# Patient Record
Sex: Female | Born: 1970 | Race: White | Hispanic: No | Marital: Married | Smoking: Never smoker
Health system: Southern US, Community
[De-identification: ages and names within clinical notes are randomized; demographics above are authoritative.]

## PROBLEM LIST (undated history)

## (undated) DIAGNOSIS — I10 Essential (primary) hypertension: Secondary | ICD-10-CM

## (undated) DIAGNOSIS — N83209 Unspecified ovarian cyst, unspecified side: Secondary | ICD-10-CM

## (undated) DIAGNOSIS — E782 Mixed hyperlipidemia: Secondary | ICD-10-CM

## (undated) DIAGNOSIS — R600 Localized edema: Secondary | ICD-10-CM

## (undated) DIAGNOSIS — Z803 Family history of malignant neoplasm of breast: Secondary | ICD-10-CM

## (undated) DIAGNOSIS — D124 Benign neoplasm of descending colon: Secondary | ICD-10-CM

## (undated) DIAGNOSIS — N840 Polyp of corpus uteri: Secondary | ICD-10-CM

## (undated) DIAGNOSIS — N939 Abnormal uterine and vaginal bleeding, unspecified: Secondary | ICD-10-CM

## (undated) DIAGNOSIS — N200 Calculus of kidney: Secondary | ICD-10-CM

## (undated) DIAGNOSIS — Z87442 Personal history of urinary calculi: Secondary | ICD-10-CM

## (undated) DIAGNOSIS — R920 Mammographic microcalcification found on diagnostic imaging of breast: Secondary | ICD-10-CM

## (undated) HISTORY — PX: TONSILLECTOMY: SUR1361

## (undated) HISTORY — PX: CHOLECYSTECTOMY, LAPAROSCOPIC: SHX56

## (undated) HISTORY — DX: Mammographic microcalcification found on diagnostic imaging of breast: R92.0

## (undated) HISTORY — DX: Family history of malignant neoplasm of breast: Z80.3

## (undated) HISTORY — DX: Abnormal uterine and vaginal bleeding, unspecified: N93.9

## (undated) HISTORY — DX: Calculus of kidney: N20.0

## (undated) HISTORY — DX: Unspecified ovarian cyst, unspecified side: N83.209

## (undated) HISTORY — PX: CHOLECYSTECTOMY: SHX55

## (undated) HISTORY — PX: TUBAL LIGATION: SHX77

## (undated) HISTORY — DX: Localized edema: R60.0

## (undated) HISTORY — DX: Essential (primary) hypertension: I10

## (undated) HISTORY — PX: DILATION AND CURETTAGE OF UTERUS: SHX78

---
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
## 2016-02-24 LAB — HM PAP SMEAR: HM Pap smear: NORMAL

## 2018-03-11 LAB — HM MAMMOGRAPHY

## 2019-03-03 ENCOUNTER — Ambulatory Visit: Payer: BC Managed Care – PPO | Admitting: Family Medicine

## 2019-03-03 ENCOUNTER — Encounter: Payer: Self-pay | Admitting: Family Medicine

## 2019-03-03 ENCOUNTER — Other Ambulatory Visit: Payer: Self-pay

## 2019-03-03 VITALS — BP 108/64 | HR 64 | Temp 96.2°F | Ht 66.0 in | Wt 152.0 lb

## 2019-03-03 DIAGNOSIS — I10 Essential (primary) hypertension: Secondary | ICD-10-CM

## 2019-03-03 DIAGNOSIS — E538 Deficiency of other specified B group vitamins: Secondary | ICD-10-CM

## 2019-03-03 DIAGNOSIS — Z803 Family history of malignant neoplasm of breast: Secondary | ICD-10-CM | POA: Diagnosis not present

## 2019-03-03 DIAGNOSIS — R600 Localized edema: Secondary | ICD-10-CM | POA: Diagnosis not present

## 2019-03-03 DIAGNOSIS — E559 Vitamin D deficiency, unspecified: Secondary | ICD-10-CM

## 2019-03-03 MED ORDER — HYDROCHLOROTHIAZIDE 25 MG PO TABS: 25.0000 mg | ORAL_TABLET | Freq: Every day | ORAL | 3 refills | Status: DC

## 2019-03-03 NOTE — Assessment & Plan Note (Signed)
Her family history is fairly significant for breast cancer, with a maternal uncle and her sister having breast cancer at a young age Her sister is reportedly BRCA negative, which is reassuring Encouraged regular mammograms Patient would like referral to GYN today, so that was placed We send ROI to her previous GYN for her last mammogram records and will order this if these are received prior to her seeing GYN

## 2019-03-03 NOTE — Progress Notes (Signed)
Patient: Anita Cowan, Female    DOB: 06-17-1970, 49 y.o.   MRN: [id number] Visit Date: 03/03/2019  Today's Provider: Lavon Paganini, MD   Chief Complaint  Patient presents with  . Establish Care   Subjective:     Establish Care Anita Cowan is a 49 y.o. female who presents today to establish care. She feels well. She reports exercising some. She reports she is sleeping well.  She moved to the area from [place] last summer  HTN: - Medications: Triamterene HCTZ 37.5-25 mg daily - Compliance: Good - Checking BP at home: Not recently - Denies any SOB, CP, vision changes, LE edema, medication SEs, or symptoms of hypotension -She does have persistent dependent lower extremity edema.  This is improved when she is on HCTZ or other diuretic, but was significant prior to being on a diuretic - Diet: Low-sodium and balanced   She has a family history of breast cancer in her sister in her 57s and in a maternal uncle.  Her sister was tested and did not have any genetic predisposition for breast cancer.  The patient herself has not been tested.  She states that her last mammogram had some calcifications on it and she thinks that she may be due for a repeat mammogram. -----------------------------------------------------------------   Review of Systems  Constitutional: Negative.   HENT: Negative.   Eyes: Negative.   Respiratory: Negative.   Cardiovascular: Positive for palpitations and leg swelling. Negative for chest pain.  Gastrointestinal: Negative.   Endocrine: Negative.   Genitourinary: Negative.   Musculoskeletal: Negative.   Skin: Negative.   Allergic/Immunologic: Positive for food allergies. Negative for environmental allergies and immunocompromised state.  Neurological: Negative.   Hematological: Negative.   Psychiatric/Behavioral: Negative.     Social History      She  reports that she has never smoked. She has never used smokeless tobacco. She reports that  she does not drink alcohol or use drugs.       Social History   Socioeconomic History  . Marital status: Married    Spouse name: Not on file  . Number of children: 3  . Years of education: Not on file  . Highest education level: Not on file  Occupational History  . Occupation: Pharmacist, hospital  Tobacco Use  . Smoking status: Never Smoker  . Smokeless tobacco: Never Used  Substance and Sexual Activity  . Alcohol use: Never  . Drug use: Never  . Sexual activity: Yes    Partners: Male    Birth control/protection: Surgical  Other Topics Concern  . Not on file  Social History Narrative  . Not on file   Social Determinants of Health   Financial Resource Strain:   . Difficulty of Paying Living Expenses: Not on file  Food Insecurity:   . Worried About Charity fundraiser in the Last Year: Not on file  . Ran Out of Food in the Last Year: Not on file  Transportation Needs:   . Lack of Transportation (Medical): Not on file  . Lack of Transportation (Non-Medical): Not on file  Physical Activity:   . Days of Exercise per Week: Not on file  . Minutes of Exercise per Session: Not on file  Stress:   . Feeling of Stress : Not on file  Social Connections:   . Frequency of Communication with Friends and Family: Not on file  . Frequency of Social Gatherings with Friends and Family: Not on file  . Attends Religious Services: Not  on file  . Active Member of Clubs or Organizations: Not on file  . Attends Archivist Meetings: Not on file  . Marital Status: Not on file    Past Medical History:  Diagnosis Date  . Essential hypertension   . Kidney stones   . Lower extremity edema      Patient Active Problem List   Diagnosis Date Noted  . Family history of breast cancer 03/03/2019  . Essential hypertension   . Lower extremity edema     Past Surgical History:  Procedure Laterality Date  . CHOLECYSTECTOMY, LAPAROSCOPIC    . KNEE ARTHROSCOPY Right 1989  . TONSILLECTOMY    .  TUBAL LIGATION      Family History        Family Status  Relation Name Status  . Mother  Alive  . Sister  Alive  . MGM  Deceased  . Mat Uncle  Deceased  . Mat Uncle  (Not Specified)  . Mat Uncle  (Not Specified)  . Neg Hx  (Not Specified)        Her family history includes Bladder Cancer in her maternal uncle; Breast cancer in her maternal uncle; Breast cancer (age of onset: 64) in her sister; Cervical cancer in her mother; Diabetes in her maternal uncle; Hypertension in her mother; Lung cancer in her maternal grandmother and maternal uncle; Lupus in her mother; Thyroid nodules in her sister. There is no history of Colon cancer.      Allergies  Allergen Reactions  . Azithromycin   . Codeine   . Penicillins   . Sulfa Antibiotics   . Tramadol   . Wheat Extract     Other reaction(s): GI Intolerance     Current Outpatient Medications:  .  Apple Cid Vn-Grn Tea-Bit Or-Cr (APPLE CIDER VINEGAR PLUS PO), Take by mouth., Disp: , Rfl:  .  cholecalciferol (VITAMIN D3) 25 MCG (1000 UNIT) tablet, Take 1,000 Units by mouth daily., Disp: , Rfl:  .  Collagen Hydrolysate POWD, by Does not apply route., Disp: , Rfl:  .  ELDERBERRY PO, Take by mouth., Disp: , Rfl:  .  Multiple Vitamin (MULTIVITAMIN) capsule, Take 1 capsule by mouth daily., Disp: , Rfl:  .  Turmeric (QC TUMERIC COMPLEX PO), Take by mouth., Disp: , Rfl:  .  vitamin B-12 (CYANOCOBALAMIN) 500 MCG tablet, Take 500 mcg by mouth daily., Disp: , Rfl:  .  hydrochlorothiazide (HYDRODIURIL) 25 MG tablet, Take 1 tablet (25 mg total) by mouth daily., Disp: 30 tablet, Rfl: 3   Patient Care Team: Virginia Crews, MD as PCP - General (Family Medicine)    Objective:    Vitals: BP 108/64 (BP Location: Right Arm, Patient Position: Sitting, Cuff Size: Large)   Pulse 64   Temp (!) 96.2 F (35.7 C) (Temporal)   Ht '5\' 6"'  (1.676 m)   Wt 152 lb (68.9 kg)   SpO2 99%   BMI 24.53 kg/m    Vitals:   03/03/19 1507  BP: 108/64  Pulse: 64   Temp: (!) 96.2 F (35.7 C)  TempSrc: Temporal  SpO2: 99%  Weight: 152 lb (68.9 kg)  Height: '5\' 6"'  (1.676 m)     Physical Exam   Depression Screen PHQ 2/9 Scores 03/03/2019  PHQ - 2 Score 0  PHQ- 9 Score 0       Assessment & Plan:     Establish care  Exercise Activities and Dietary recommendations Goals   None  There is no immunization history on file for this patient.  Health Maintenance  Topic Date Due  . HIV Screening  11/01/1986  . TETANUS/TDAP  11/01/1990  . PAP SMEAR-Modifier  10/31/1992  . INFLUENZA VACCINE  04/23/2019 (Originally 08/24/2018)     Discussed health benefits of physical activity, and encouraged her to engage in regular exercise appropriate for her age and condition.     ROI sent to previous PCP and GYN --------------------------------------------------------------------  Problem List Items Addressed This Visit      Cardiovascular and Mediastinum   Essential hypertension - Primary    Chronic and well-controlled Recent weight loss and improvement in diet likely helped this We will change her triamterene HCTZ to HCTZ 25 mg daily Check metabolic panel Screening lipid panel Follow-up in 6 months or sooner if home blood pressure elevates      Relevant Medications   hydrochlorothiazide (HYDRODIURIL) 25 MG tablet   Other Relevant Orders   Comprehensive metabolic panel   Lipid panel   CBC     Other   Lower extremity edema    Likely has some venous insufficiency It is improved with diuretic, so we will continue HCTZ as above Encourage compression stocking use      Relevant Orders   CBC   Family history of breast cancer    Her family history is fairly significant for breast cancer, with a maternal uncle and her sister having breast cancer at a young age Her sister is reportedly BRCA negative, which is reassuring Encouraged regular mammograms Patient would like referral to GYN today, so that was placed We send ROI to her  previous GYN for her last mammogram records and will order this if these are received prior to her seeing GYN      Relevant Orders   Ambulatory referral to Gynecology    Other Visit Diagnoses    Avitaminosis D       Relevant Orders   VITAMIN D 25 Hydroxy (Vit-D Deficiency, Fractures)   B12 deficiency       Relevant Orders   B12       Return in about 6 months (around 08/31/2019) for CPE.   The entirety of the information documented in the History of Present Illness, Review of Systems and Physical Exam were personally obtained by me. Portions of this information were initially documented by Ashley Royalty, CMA and reviewed by me for thoroughness and accuracy.    , Dionne Bucy, MD MPH [facility]

## 2019-03-03 NOTE — Assessment & Plan Note (Signed)
Chronic and well-controlled Recent weight loss and improvement in diet likely helped this We will change her triamterene HCTZ to HCTZ 25 mg daily Check metabolic panel Screening lipid panel Follow-up in 6 months or sooner if home blood pressure elevates

## 2019-03-03 NOTE — Patient Instructions (Signed)
DASH Eating Plan DASH stands for "Dietary Approaches to Stop Hypertension." The DASH eating plan is a healthy eating plan that has been shown to reduce high blood pressure (hypertension). It may also reduce your risk for type 2 diabetes, heart disease, and stroke. The DASH eating plan may also help with weight loss. What are tips for following this plan?  General guidelines  Avoid eating more than 2,300 mg (milligrams) of salt (sodium) a day. If you have hypertension, you may need to reduce your sodium intake to 1,500 mg a day.  Limit alcohol intake to no more than 1 drink a day for nonpregnant women and 2 drinks a day for men. One drink equals 12 oz of beer, 5 oz of wine, or 1 oz of hard liquor.  Work with your health care provider to maintain a healthy body weight or to lose weight. Ask what an ideal weight is for you.  Get at least 30 minutes of exercise that causes your heart to beat faster (aerobic exercise) most days of the week. Activities may include walking, swimming, or biking.  Work with your health care provider or diet and nutrition specialist (dietitian) to adjust your eating plan to your individual calorie needs. Reading food labels   Check food labels for the amount of sodium per serving. Choose foods with less than 5 percent of the Daily Value of sodium. Generally, foods with less than 300 mg of sodium per serving fit into this eating plan.  To find whole grains, look for the word "whole" as the first word in the ingredient list. Shopping  Buy products labeled as "low-sodium" or "no salt added."  Buy fresh foods. Avoid canned foods and premade or frozen meals. Cooking  Avoid adding salt when cooking. Use salt-free seasonings or herbs instead of table salt or sea salt. Check with your health care provider or pharmacist before using salt substitutes.  Do not fry foods. Cook foods using healthy methods such as baking, boiling, grilling, and broiling instead.  Cook with  heart-healthy oils, such as olive, canola, soybean, or sunflower oil. Meal planning  Eat a balanced diet that includes: ? 5 or more servings of fruits and vegetables each day. At each meal, try to fill half of your plate with fruits and vegetables. ? Up to 6-8 servings of whole grains each day. ? Less than 6 oz of lean meat, poultry, or fish each day. A 3-oz serving of meat is about the same size as a deck of cards. One egg equals 1 oz. ? 2 servings of low-fat dairy each day. ? A serving of nuts, seeds, or beans 5 times each week. ? Heart-healthy fats. Healthy fats called Omega-3 fatty acids are found in foods such as flaxseeds and coldwater fish, like sardines, salmon, and mackerel.  Limit how much you eat of the following: ? Canned or prepackaged foods. ? Food that is high in trans fat, such as fried foods. ? Food that is high in saturated fat, such as fatty meat. ? Sweets, desserts, sugary drinks, and other foods with added sugar. ? Full-fat dairy products.  Do not salt foods before eating.  Try to eat at least 2 vegetarian meals each week.  Eat more home-cooked food and less restaurant, buffet, and fast food.  When eating at a restaurant, ask that your food be prepared with less salt or no salt, if possible. What foods are recommended? The items listed may not be a complete list. Talk with your dietitian about   what dietary choices are best for you. Grains Whole-grain or whole-wheat bread. Whole-grain or whole-wheat pasta. Brown rice. Oatmeal. Quinoa. Bulgur. Whole-grain and low-sodium cereals. Pita bread. Low-fat, low-sodium crackers. Whole-wheat flour tortillas. Vegetables Fresh or frozen vegetables (raw, steamed, roasted, or grilled). Low-sodium or reduced-sodium tomato and vegetable juice. Low-sodium or reduced-sodium tomato sauce and tomato paste. Low-sodium or reduced-sodium canned vegetables. Fruits All fresh, dried, or frozen fruit. Canned fruit in natural juice (without  added sugar). Meat and other protein foods Skinless chicken or turkey. Ground chicken or turkey. Pork with fat trimmed off. Fish and seafood. Egg whites. Dried beans, peas, or lentils. Unsalted nuts, nut butters, and seeds. Unsalted canned beans. Lean cuts of beef with fat trimmed off. Low-sodium, lean deli meat. Dairy Low-fat (1%) or fat-free (skim) milk. Fat-free, low-fat, or reduced-fat cheeses. Nonfat, low-sodium ricotta or cottage cheese. Low-fat or nonfat yogurt. Low-fat, low-sodium cheese. Fats and oils Soft margarine without trans fats. Vegetable oil. Low-fat, reduced-fat, or light mayonnaise and salad dressings (reduced-sodium). Canola, safflower, olive, soybean, and sunflower oils. Avocado. Seasoning and other foods Herbs. Spices. Seasoning mixes without salt. Unsalted popcorn and pretzels. Fat-free sweets. What foods are not recommended? The items listed may not be a complete list. Talk with your dietitian about what dietary choices are best for you. Grains Baked goods made with fat, such as croissants, muffins, or some breads. Dry pasta or rice meal packs. Vegetables Creamed or fried vegetables. Vegetables in a cheese sauce. Regular canned vegetables (not low-sodium or reduced-sodium). Regular canned tomato sauce and paste (not low-sodium or reduced-sodium). Regular tomato and vegetable juice (not low-sodium or reduced-sodium). Pickles. Olives. Fruits Canned fruit in a light or heavy syrup. Fried fruit. Fruit in cream or butter sauce. Meat and other protein foods Fatty cuts of meat. Ribs. Fried meat. Bacon. Sausage. Bologna and other processed lunch meats. Salami. Fatback. Hotdogs. Bratwurst. Salted nuts and seeds. Canned beans with added salt. Canned or smoked fish. Whole eggs or egg yolks. Chicken or turkey with skin. Dairy Whole or 2% milk, cream, and half-and-half. Whole or full-fat cream cheese. Whole-fat or sweetened yogurt. Full-fat cheese. Nondairy creamers. Whipped toppings.  Processed cheese and cheese spreads. Fats and oils Butter. Stick margarine. Lard. Shortening. Ghee. Bacon fat. Tropical oils, such as coconut, palm kernel, or palm oil. Seasoning and other foods Salted popcorn and pretzels. Onion salt, garlic salt, seasoned salt, table salt, and sea salt. Worcestershire sauce. Tartar sauce. Barbecue sauce. Teriyaki sauce. Soy sauce, including reduced-sodium. Steak sauce. Canned and packaged gravies. Fish sauce. Oyster sauce. Cocktail sauce. Horseradish that you find on the shelf. Ketchup. Mustard. Meat flavorings and tenderizers. Bouillon cubes. Hot sauce and Tabasco sauce. Premade or packaged marinades. Premade or packaged taco seasonings. Relishes. Regular salad dressings. Where to find more information:  National Heart, Lung, and Blood Institute: www.nhlbi.nih.gov  American Heart Association: www.heart.org Summary  The DASH eating plan is a healthy eating plan that has been shown to reduce high blood pressure (hypertension). It may also reduce your risk for type 2 diabetes, heart disease, and stroke.  With the DASH eating plan, you should limit salt (sodium) intake to 2,300 mg a day. If you have hypertension, you may need to reduce your sodium intake to 1,500 mg a day.  When on the DASH eating plan, aim to eat more fresh fruits and vegetables, whole grains, lean proteins, low-fat dairy, and heart-healthy fats.  Work with your health care provider or diet and nutrition specialist (dietitian) to adjust your eating plan to your   individual calorie needs. This information is not intended to replace advice given to you by your health care provider. Make sure you discuss any questions you have with your health care provider. Document Revised: 12/22/2016 Document Reviewed: 01/03/2016 Elsevier Patient Education  2020 [employer].  

## 2019-03-03 NOTE — Assessment & Plan Note (Signed)
Likely has some venous insufficiency It is improved with diuretic, so we will continue HCTZ as above Encourage compression stocking use

## 2019-03-18 ENCOUNTER — Telehealth: Payer: Self-pay

## 2019-03-18 LAB — CBC
Hematocrit: 45.1 % (ref 34.0–46.6)
Hemoglobin: 15 g/dL (ref 11.1–15.9)
MCH: 30.1 pg (ref 26.6–33.0)
MCHC: 33.3 g/dL (ref 31.5–35.7)
MCV: 90 fL (ref 79–97)
Platelets: 333 10*3/uL (ref 150–450)
RBC: 4.99 x10E6/uL (ref 3.77–5.28)
RDW: 13 % (ref 11.7–15.4)
WBC: 6.8 10*3/uL (ref 3.4–10.8)

## 2019-03-18 LAB — COMPREHENSIVE METABOLIC PANEL
ALT: 14 IU/L (ref 0–32)
AST: 20 IU/L (ref 0–40)
Albumin/Globulin Ratio: 2.1 (ref 1.2–2.2)
Albumin: 4.9 g/dL — ABNORMAL HIGH (ref 3.8–4.8)
Alkaline Phosphatase: 51 IU/L (ref 39–117)
BUN/Creatinine Ratio: 16 (ref 9–23)
BUN: 14 mg/dL (ref 6–24)
Bilirubin Total: 0.4 mg/dL (ref 0.0–1.2)
CO2: 24 mmol/L (ref 20–29)
Calcium: 9.6 mg/dL (ref 8.7–10.2)
Chloride: 97 mmol/L (ref 96–106)
Creatinine, Ser: 0.86 mg/dL (ref 0.57–1.00)
GFR calc Af Amer: 93 mL/min/{1.73_m2} (ref >59)
GFR calc non Af Amer: 81 mL/min/{1.73_m2} (ref >59)
Globulin, Total: 2.3 g/dL (ref 1.5–4.5)
Glucose: 89 mg/dL (ref 65–99)
Potassium: 3.6 mmol/L (ref 3.5–5.2)
Sodium: 136 mmol/L (ref 134–144)
Total Protein: 7.2 g/dL (ref 6.0–8.5)

## 2019-03-18 LAB — LIPID PANEL
Chol/HDL Ratio: 3.9 ratio (ref 0.0–4.4)
Cholesterol, Total: 188 mg/dL (ref 100–199)
HDL: 48 mg/dL (ref >39)
LDL Chol Calc (NIH): 123 mg/dL — ABNORMAL HIGH (ref 0–99)
Triglycerides: 93 mg/dL (ref 0–149)
VLDL Cholesterol Cal: 17 mg/dL (ref 5–40)

## 2019-03-18 LAB — VITAMIN D 25 HYDROXY (VIT D DEFICIENCY, FRACTURES): Vit D, 25-Hydroxy: 48.8 ng/mL (ref 30.0–100.0)

## 2019-03-18 LAB — VITAMIN B12: Vitamin B-12: 1917 pg/mL — ABNORMAL HIGH (ref 232–1245)

## 2019-03-18 NOTE — Telephone Encounter (Signed)
-----   Message from Virginia Crews, MD sent at 03/18/2019  9:31 AM EST ----- Normal labs, except high B12 level.  She can cut back on the amount of B12 that she is taking by half.

## 2019-03-18 NOTE — Telephone Encounter (Signed)
Tired to call patient and no answer, left voicemail for patient to call back. If patient returns call it is ok for [facility] to advise patient of results.

## 2019-03-18 NOTE — Telephone Encounter (Signed)
Called left message for return call.

## 2019-03-18 NOTE — Progress Notes (Signed)
PCP:  Virginia Crews, MD   Chief Complaint  Patient presents with  . Gynecologic Exam    a lot of pain on left ovary only during cycles     HPI:      Ms. Anita Cowan is a 49 y.o. No obstetric history on file. who LMP was Patient's last menstrual period was 02/24/2019 (approximate)., presents today for her NP annual examination.  Her menses are regular every 28-30 days, lasting 7 days.  Dysmenorrhea moderate, improved with ibup/heating pad. Has had LLQ pain mid cycle and with menses for past few months. Feels similar to ovar cyst LT side a few yrs ago. Has had LLQ pain with sex recently too, which is unusual for pt. She does not have intermenstrual bleeding.  Sex activity: single partner, contraception - tubal ligation. No postcoital bleeding. Last Pap: not recent; no hx of abn paps. Hx of STDs: none  Last mammogram: 03/11/18 in [place]. Has been followed for LT breast microcalcifications for a few yrs. Thinks mammo is due again 2/21. Trying to get records sent.  There is a FH of breast cancer in her sister and mat uncle twice. Her sister is BRCA neg 2 yrs ago but didn't do panel testing. Pt has not had genetic testing. There is no FH of ovarian cancer. The patient does do self-breast exams.  Tobacco use: The patient denies current or previous tobacco use. Alcohol use: none No drug use.  Exercise: moderately active  She does get adequate calcium and Vitamin D in her diet.  Labs with PCP  Past Medical History:  Diagnosis Date  . Breast microcalcifications   . Essential hypertension   . Family history of breast cancer   . Kidney stones   . Lower extremity edema   . Ovarian cyst     Past Surgical History:  Procedure Laterality Date  . CHOLECYSTECTOMY, LAPAROSCOPIC    . KNEE ARTHROSCOPY Right 1989  . TONSILLECTOMY    . TUBAL LIGATION      Family History  Problem Relation Age of Onset  . Hypertension Mother   . Lupus Mother        discoid  . Cervical  cancer Mother   . Breast cancer Sister 78  . Thyroid nodules Sister        thyroid tumor in her teens  . Lung cancer Maternal Grandmother        smoker  . Diabetes Maternal Uncle   . Breast cancer Maternal Uncle        twice  . Bladder Cancer Maternal Uncle   . Lung cancer Maternal Uncle   . Colon cancer Neg Hx     Social History   Socioeconomic History  . Marital status: Married    Spouse name: Not on file  . Number of children: 3  . Years of education: Not on file  . Highest education level: Not on file  Occupational History  . Occupation: Pharmacist, hospital  Tobacco Use  . Smoking status: Never Smoker  . Smokeless tobacco: Never Used  Substance and Sexual Activity  . Alcohol use: Never  . Drug use: Never  . Sexual activity: Yes    Partners: Male    Birth control/protection: Surgical    Comment: Tubal ligation  Other Topics Concern  . Not on file  Social History Narrative  . Not on file   Social Determinants of Health   Financial Resource Strain:   . Difficulty of Paying Living Expenses: Not on file  Food Insecurity:   . Worried About Charity fundraiser in the Last Year: Not on file  . Ran Out of Food in the Last Year: Not on file  Transportation Needs:   . Lack of Transportation (Medical): Not on file  . Lack of Transportation (Non-Medical): Not on file  Physical Activity:   . Days of Exercise per Week: Not on file  . Minutes of Exercise per Session: Not on file  Stress:   . Feeling of Stress : Not on file  Social Connections:   . Frequency of Communication with Friends and Family: Not on file  . Frequency of Social Gatherings with Friends and Family: Not on file  . Attends Religious Services: Not on file  . Active Member of Clubs or Organizations: Not on file  . Attends Archivist Meetings: Not on file  . Marital Status: Not on file  Intimate Partner Violence:   . Fear of Current or Ex-Partner: Not on file  . Emotionally Abused: Not on file  .  Physically Abused: Not on file  . Sexually Abused: Not on file     Current Outpatient Medications:  .  Apple Cid Vn-Grn Tea-Bit Or-Cr (APPLE CIDER VINEGAR PLUS PO), Take by mouth., Disp: , Rfl:  .  cholecalciferol (VITAMIN D3) 25 MCG (1000 UNIT) tablet, Take 1,000 Units by mouth daily., Disp: , Rfl:  .  Collagen Hydrolysate POWD, by Does not apply route., Disp: , Rfl:  .  ELDERBERRY PO, Take by mouth., Disp: , Rfl:  .  hydrochlorothiazide (HYDRODIURIL) 25 MG tablet, Take 1 tablet (25 mg total) by mouth daily., Disp: 30 tablet, Rfl: 3 .  Multiple Vitamin (MULTIVITAMIN) capsule, Take 1 capsule by mouth daily., Disp: , Rfl:  .  Turmeric (QC TUMERIC COMPLEX PO), Take by mouth., Disp: , Rfl:  .  vitamin B-12 (CYANOCOBALAMIN) 500 MCG tablet, Take 500 mcg by mouth daily., Disp: , Rfl:      ROS:  Review of Systems  Constitutional: Negative for fatigue, fever and unexpected weight change.  Respiratory: Negative for cough, shortness of breath and wheezing.   Cardiovascular: Negative for chest pain, palpitations and leg swelling.  Gastrointestinal: Negative for blood in stool, constipation, diarrhea, nausea and vomiting.  Endocrine: Negative for cold intolerance, heat intolerance and polyuria.  Genitourinary: Positive for dyspareunia and pelvic pain. Negative for dysuria, flank pain, frequency, genital sores, hematuria, menstrual problem, urgency, vaginal bleeding, vaginal discharge and vaginal pain.  Musculoskeletal: Negative for back pain, joint swelling and myalgias.  Skin: Negative for rash.  Neurological: Negative for dizziness, syncope, light-headedness, numbness and headaches.  Hematological: Negative for adenopathy.  Psychiatric/Behavioral: Negative for agitation, confusion, sleep disturbance and suicidal ideas. The patient is not nervous/anxious.   BREAST: No symptoms   Objective: BP 100/70   Ht '5\' 6"'  (1.676 m)   Wt 148 lb (67.1 kg)   LMP 02/24/2019 (Approximate)   BMI 23.89  kg/m    Physical Exam Constitutional:      Appearance: She is well-developed.  Genitourinary:     Vulva, vagina, cervix, uterus, right adnexa and left adnexa normal.     No vulval lesion or tenderness noted.     No vaginal discharge, erythema or tenderness.     No cervical polyp.     Uterus is not enlarged or tender.     No right or left adnexal mass present.     Right adnexa not tender.     Left adnexa not tender.  Neck:  Thyroid: No thyromegaly.  Cardiovascular:     Rate and Rhythm: Normal rate and regular rhythm.     Heart sounds: Normal heart sounds. No murmur.  Pulmonary:     Effort: Pulmonary effort is normal.     Breath sounds: Normal breath sounds.  Chest:     Breasts:        Right: No mass, nipple discharge, skin change or tenderness.        Left: No mass, nipple discharge, skin change or tenderness.  Abdominal:     Palpations: Abdomen is soft.     Tenderness: There is no abdominal tenderness. There is no guarding.  Musculoskeletal:        General: Normal range of motion.     Cervical back: Normal range of motion.  Neurological:     General: No focal deficit present.     Mental Status: She is alert and oriented to person, place, and time.     Cranial Nerves: No cranial nerve deficit.  Skin:    General: Skin is warm and dry.  Psychiatric:        Mood and Affect: Mood normal.        Behavior: Behavior normal.        Thought Content: Thought content normal.        Judgment: Judgment normal.  Vitals reviewed.     Results:  ULTRASOUND REPORT  Location: [facility]  Date of Service: 03/19/2019    Indications:Pelvic Pain Findings:  The uterus is retroverted and measures 9.2 x 6.3 x 5.4 cm. Echo texture is homogenous without evidence of focal masses. The Endometrium measures 15.7 mm.  Right Ovary measures 3.0 x 1.7 x 1.5 cm. It is normal in appearance. Left Ovary measures 3.2 x 2.5 x 2.3 cm. It is normal in appearance. There is a corpus  luteal cyst in the left ovary that measures 18.7 x 16.6 x 20.2 mm Survey of the adnexa demonstrates no adnexal masses. There is no free fluid in the cul de sac.  Impression: 1. Normal uterus and cervix.  2. Normal ovaries bilaterally.  3. There is a corpus luteal cyst in the left ovary.   Recommendations: 1.Clinical correlation with the patient's History and Physical Exam.   Gweneth Dimitri, RT  Assessment/Plan:  Encounter for annual routine gynecological examination  Cervical cancer screening - Plan: Cytology - PAP  Screening for HPV (human papillomavirus) - Plan: Cytology - PAP  Encounter for screening mammogram for malignant neoplasm of breast - Plan: MM 3D SCREEN BREAST BILATERAL; pt to sched mammo. May need dx +/- u/s depending on prior mammo results. Pt going to get records faxed to me for review. Will change order prn.   Family history of breast cancer - Plan: MM 3D SCREEN BREAST BILATERAL; MyRisk testing discussed and pt will RTO for labs. Has "1 good vein" and just had blood drawn 2 days ago.   LLQ pain - Plan: US PELVIS TRANSVAGINAL NON-OB (TV ONLY); Corpus luteal cyst LT ovary. Could be cause of sx. Should resolve, no further imaging needed. NSAIDs. F/u prn.             GYN counsel breast self exam, mammography screening, adequate intake of calcium and vitamin D, diet and exercise     F/U  Return in about 1 year (around 03/18/2020) for annual/ RTO with RN for [facility] testing.   B. , PA-C 03/19/2019 4:55 PM

## 2019-03-19 ENCOUNTER — Ambulatory Visit (INDEPENDENT_AMBULATORY_CARE_PROVIDER_SITE_OTHER): Payer: BC Managed Care – PPO

## 2019-03-19 ENCOUNTER — Other Ambulatory Visit: Payer: Self-pay

## 2019-03-19 ENCOUNTER — Encounter: Payer: Self-pay | Admitting: Obstetrics and Gynecology

## 2019-03-19 ENCOUNTER — Ambulatory Visit (INDEPENDENT_AMBULATORY_CARE_PROVIDER_SITE_OTHER): Payer: BC Managed Care – PPO | Admitting: Obstetrics and Gynecology

## 2019-03-19 ENCOUNTER — Other Ambulatory Visit (HOSPITAL_COMMUNITY)
Admission: RE | Admit: 2019-03-19 | Discharge: 2019-03-19 | Disposition: A | Discharge status: Home / Self Care | Payer: BC Managed Care – PPO | Source: Ambulatory Visit | Attending: Obstetrics and Gynecology | Admitting: Obstetrics and Gynecology

## 2019-03-19 VITALS — BP 100/70 | Ht 66.0 in | Wt 148.0 lb

## 2019-03-19 DIAGNOSIS — R1032 Left lower quadrant pain: Secondary | ICD-10-CM

## 2019-03-19 DIAGNOSIS — Z124 Encounter for screening for malignant neoplasm of cervix: Secondary | ICD-10-CM | POA: Insufficient documentation

## 2019-03-19 DIAGNOSIS — Z01419 Encounter for gynecological examination (general) (routine) without abnormal findings: Secondary | ICD-10-CM

## 2019-03-19 DIAGNOSIS — Z1151 Encounter for screening for human papillomavirus (HPV): Secondary | ICD-10-CM

## 2019-03-19 DIAGNOSIS — N8312 Corpus luteum cyst of left ovary: Secondary | ICD-10-CM

## 2019-03-19 DIAGNOSIS — Z1231 Encounter for screening mammogram for malignant neoplasm of breast: Secondary | ICD-10-CM

## 2019-03-19 DIAGNOSIS — N941 Unspecified dyspareunia: Secondary | ICD-10-CM

## 2019-03-19 DIAGNOSIS — Z01411 Encounter for gynecological examination (general) (routine) with abnormal findings: Secondary | ICD-10-CM

## 2019-03-19 DIAGNOSIS — Z803 Family history of malignant neoplasm of breast: Secondary | ICD-10-CM

## 2019-03-19 NOTE — Patient Instructions (Signed)
I value your feedback and entrusting us with your care. If you get a [facility] patient survey, I would appreciate you taking the time to let us know about your experience today. Thank you!  As of January 02, 2019, your lab results will be released to your MyChart immediately, before I even have a chance to see them. Please give me time to review them and contact you if there are any abnormalities. Thank you for your patience.   [facility]: [phone]  [facility]: [phone]  

## 2019-03-20 NOTE — Telephone Encounter (Signed)
Patient returned call- notified of lab results and PCP recommendation

## 2019-03-21 LAB — CYTOLOGY - PAP
Comment: NEGATIVE
Diagnosis: NEGATIVE
High risk HPV: NEGATIVE

## 2019-03-24 ENCOUNTER — Other Ambulatory Visit: Payer: Self-pay

## 2019-03-24 ENCOUNTER — Other Ambulatory Visit: Payer: BC Managed Care – PPO

## 2019-03-24 ENCOUNTER — Ambulatory Visit: Payer: BC Managed Care – PPO

## 2019-03-24 DIAGNOSIS — Z1371 Encounter for nonprocreative screening for genetic disease carrier status: Secondary | ICD-10-CM

## 2019-03-24 HISTORY — DX: Encounter for nonprocreative screening for genetic disease carrier status: Z13.71

## 2019-03-31 ENCOUNTER — Encounter: Payer: Self-pay | Admitting: Obstetrics and Gynecology

## 2019-04-08 ENCOUNTER — Encounter: Payer: Self-pay | Admitting: Obstetrics and Gynecology

## 2019-04-10 ENCOUNTER — Encounter: Payer: Self-pay | Admitting: Family Medicine

## 2019-04-12 ENCOUNTER — Encounter: Payer: Self-pay | Admitting: Obstetrics and Gynecology

## 2019-04-15 ENCOUNTER — Telehealth: Payer: Self-pay | Admitting: Obstetrics and Gynecology

## 2019-04-15 NOTE — Telephone Encounter (Signed)
Pt aware of neg MyRisk results. IBIS=12.8%/riskscore=18.9%. No extra screening recommendations. Cont monthly SBE, yearly CBE and mammo.   Patient understands these results only apply to her and her children, and this is not indicative of genetic testing results of her other family members. It is recommended that her other family members have genetic testing done.  Pt also understands negative genetic testing doesn't mean she will never get any of these cancers.   Hard copy mailed to pt. F/u prn.

## 2019-05-05 ENCOUNTER — Ambulatory Visit
Admission: RE | Admit: 2019-05-05 | Discharge: 2019-05-05 | Disposition: A | Discharge status: Home / Self Care | Payer: BC Managed Care – PPO | Source: Ambulatory Visit | Attending: Obstetrics and Gynecology | Admitting: Obstetrics and Gynecology

## 2019-05-05 DIAGNOSIS — Z1231 Encounter for screening mammogram for malignant neoplasm of breast: Secondary | ICD-10-CM | POA: Diagnosis not present

## 2019-05-05 DIAGNOSIS — Z803 Family history of malignant neoplasm of breast: Secondary | ICD-10-CM | POA: Insufficient documentation

## 2019-05-19 ENCOUNTER — Encounter: Payer: Self-pay | Admitting: Obstetrics and Gynecology

## 2019-05-20 ENCOUNTER — Telehealth: Payer: Self-pay

## 2019-05-20 NOTE — Telephone Encounter (Signed)
Copied from [place] [id number]. Topic: General - Other >> May 20, 2019  8:16 AM Leward Quan A wrote: Reason for CRM: Patient called to inquire of Dr B that if she was exposed to covid and has no symptoms. She would like Dr B. Insight if she should get tested and since she have no symptoms or can she just go ahead and get her covid vaccine. Would like a call back at Ph# [phone]

## 2019-05-20 NOTE — Telephone Encounter (Signed)
I would recommend testing on day 5-7 after exposure.  If negative, proceed with COVID vaccine.

## 2019-05-20 NOTE — Telephone Encounter (Signed)
Patient advised as directed below. 

## 2019-07-18 ENCOUNTER — Encounter: Payer: Self-pay | Admitting: Family Medicine

## 2019-07-18 ENCOUNTER — Ambulatory Visit: Payer: BC Managed Care – PPO | Admitting: Family Medicine

## 2019-07-18 ENCOUNTER — Other Ambulatory Visit: Payer: Self-pay

## 2019-07-18 VITALS — BP 109/74 | HR 73 | Temp 97.1°F | Resp 16 | Ht 66.0 in | Wt 138.0 lb

## 2019-07-18 DIAGNOSIS — G478 Other sleep disorders: Secondary | ICD-10-CM | POA: Diagnosis not present

## 2019-07-18 DIAGNOSIS — R0681 Apnea, not elsewhere classified: Secondary | ICD-10-CM | POA: Diagnosis not present

## 2019-07-18 DIAGNOSIS — I10 Essential (primary) hypertension: Secondary | ICD-10-CM | POA: Diagnosis not present

## 2019-07-18 NOTE — Assessment & Plan Note (Addendum)
Well controlled  With weight loss and healthy lifestyle changes D/C HCTZ do to low readings Was doing HCTZ 12.5mg  daily Continue monitoring BP  Follow up in August for CPE

## 2019-07-18 NOTE — Progress Notes (Signed)
Established patient visit   Patient: Anita Cowan   DOB: 1970/08/02   49 y.o. Female  MRN: [id number] Visit Date: 07/18/2019  I,Sulibeya S Dimas,acting as a scribe for Lavon Paganini, MD.,have documented all relevant documentation on the behalf of Lavon Paganini, MD,as directed by  Lavon Paganini, MD while in the presence of Lavon Paganini, MD.  Today's healthcare provider: Lavon Paganini, MD   Chief Complaint  Patient presents with  . Hypertension  . Sleep Apnea   Subjective    HPI Hypertension, follow-up  BP Readings from Last 3 Encounters:  07/18/19 109/74  03/19/19 100/70  03/03/19 108/64   Wt Readings from Last 3 Encounters:  07/18/19 138 lb (62.6 kg)  03/19/19 148 lb (67.1 kg)  03/03/19 152 lb (68.9 kg)     She was last seen for hypertension 4 months ago.  BP at that visit was 108/64. Management since that visit includes HCTZ 25mg  daily.  She reports excellent compliance with treatment. She is having side effects. dizziness She is following a Regular diet. She is exercising. She does not smoke.  Use of agents associated with hypertension: none.   Outside blood pressures are stable and on the lower side. Symptoms: No chest pain No chest pressure  No palpitations No syncope  No dyspnea No orthopnea  No paroxysmal nocturnal dyspnea No lower extremity edema   Pertinent labs: Lab Results  Component Value Date   CHOL 188 03/17/2019   HDL 48 03/17/2019   LDLCALC 123 (H) 03/17/2019   TRIG 93 03/17/2019   CHOLHDL 3.9 03/17/2019   Lab Results  Component Value Date   NA 136 03/17/2019   K 3.6 03/17/2019   CREATININE 0.86 03/17/2019   GFRNONAA 81 03/17/2019   GFRAA 93 03/17/2019   GLUCOSE 89 03/17/2019     The 10-year ASCVD risk score Mikey Bussing DC Jr., et al., 2013) is: 0.8%   --------------------------------------------------------------------------------------------------- Patient report witnessed sleep apnea. Patient reports  waking up some mornings feeling un rested, and with headaches.   Results of the Epworth flowsheet 07/18/2019  Sitting and reading 1  Watching TV 1  Sitting, inactive in a public place (e.g. a theatre or a meeting) 0  As a passenger in a car for an hour without a break 0  Lying down to rest in the afternoon when circumstances permit 2  Sitting and talking to someone 0  Sitting quietly after a lunch without alcohol 0  In a car, while stopped for a few minutes in traffic 0  Total score 4    Patient Active Problem List   Diagnosis Date Noted  . Witnessed episode of apnea 07/18/2019  . Family history of breast cancer 03/03/2019  . Essential hypertension   . Lower extremity edema    Social History   Tobacco Use  . Smoking status: Never Smoker  . Smokeless tobacco: Never Used  Vaping Use  . Vaping Use: Never used  Substance Use Topics  . Alcohol use: Never  . Drug use: Never       Medications: Outpatient Medications Prior to Visit  Medication Sig  . Apple Cid Vn-Grn Tea-Bit Or-Cr (APPLE CIDER VINEGAR PLUS PO) Take by mouth.  . cholecalciferol (VITAMIN D3) 25 MCG (1000 UNIT) tablet Take 1,000 Units by mouth daily.  . Collagen Hydrolysate POWD by Does not apply route.  Marland Kitchen ELDERBERRY PO Take by mouth.  . Multiple Vitamin (MULTIVITAMIN) capsule Take 1 capsule by mouth daily.  . Turmeric (QC TUMERIC COMPLEX  PO) Take by mouth.  . vitamin B-12 (CYANOCOBALAMIN) 500 MCG tablet Take 500 mcg by mouth daily.  . [DISCONTINUED] hydrochlorothiazide (HYDRODIURIL) 25 MG tablet Take 1 tablet (25 mg total) by mouth daily.   No facility-administered medications prior to visit.    Review of Systems  Constitutional: Negative for activity change, appetite change, fatigue and unexpected weight change.  Respiratory: Negative for chest tightness and shortness of breath.   Cardiovascular: Negative for chest pain and palpitations.  Neurological: Positive for dizziness.      Objective    BP  109/74 (BP Location: Left Arm, Patient Position: Sitting, Cuff Size: Normal)   Pulse 73   Temp (!) 97.1 F (36.2 C) (Temporal)   Resp 16   Ht 5\' 6"  (1.676 m)   Wt 138 lb (62.6 kg)   LMP 07/05/2019 (Exact Date)   BMI 22.27 kg/m  BP Readings from Last 3 Encounters:  07/18/19 109/74  03/19/19 100/70  03/03/19 108/64   Wt Readings from Last 3 Encounters:  07/18/19 138 lb (62.6 kg)  03/19/19 148 lb (67.1 kg)  03/03/19 152 lb (68.9 kg)      Physical Exam Vitals reviewed.  Constitutional:      General: She is not in acute distress.    Appearance: Normal appearance. She is well-developed. She is not ill-appearing or diaphoretic.  HENT:     Head: Normocephalic and atraumatic.  Eyes:     General: No scleral icterus.    Conjunctiva/sclera: Conjunctivae normal.  Neck:     Thyroid: No thyromegaly.  Cardiovascular:     Rate and Rhythm: Normal rate and regular rhythm.     Pulses: Normal pulses.     Heart sounds: Normal heart sounds. No murmur heard.   Pulmonary:     Effort: Pulmonary effort is normal. No respiratory distress.     Breath sounds: Normal breath sounds. No wheezing, rhonchi or rales.  Musculoskeletal:     Cervical back: Neck supple.     Right lower leg: No edema.     Left lower leg: No edema.  Lymphadenopathy:     Cervical: No cervical adenopathy.  Skin:    General: Skin is warm and dry.     Findings: No rash.  Neurological:     Mental Status: She is alert and oriented to person, place, and time. Mental status is at baseline.  Psychiatric:        Mood and Affect: Mood normal.        Behavior: Behavior normal.      No results found for any visits on 07/18/19.  Assessment & Plan     Problem List Items Addressed This Visit      Cardiovascular and Mediastinum   Essential hypertension - Primary    Well controlled  With weight loss and healthy lifestyle changes D/C HCTZ do to low readings Was doing HCTZ 12.5mg  daily Continue monitoring BP  Follow up in  August for CPE        Other   Witnessed episode of apnea    New problem Referred for Sleep study      Relevant Orders   PSG Sleep Study    Other Visit Diagnoses    Non-restorative sleep       Relevant Orders   PSG Sleep Study       Return in about 2 months (around 09/17/2019) for CPE, as scheduled.      I, Lavon Paganini, MD, have reviewed all documentation for this visit. The documentation  on 07/18/19 for the exam, diagnosis, procedures, and orders are all accurate and complete.   , Dionne Bucy, MD, MPH [facility]

## 2019-07-18 NOTE — Assessment & Plan Note (Signed)
New problem Referred for Sleep study

## 2019-08-26 ENCOUNTER — Encounter: Payer: BC Managed Care – PPO | Admitting: Family Medicine

## 2019-09-15 ENCOUNTER — Telehealth: Payer: Self-pay | Admitting: Family Medicine

## 2019-09-15 ENCOUNTER — Other Ambulatory Visit: Payer: Self-pay | Admitting: Family Medicine

## 2019-09-15 NOTE — Telephone Encounter (Signed)
Patient called and advised at last OV this medication HCTZ 25 mg was discontinued due to low readings. She says she was taking 12.5 mg instead of 25 mg on her chart. She says she started back taking the 12.5 mg on Monday of last week because she had started waking up with headaches and the highest reading she has recorded is 155/101. She says she took her last one this morning and her afternoon BP today was 128/91. She says the pharmacist will give her a courtesy supply of 5 pills. I advised I will send this request to the office for approval. Appointment scheduled for Thursday, 09/25/19 at 1600 with Dr. Brita Romp for f/u from last OV.

## 2019-09-15 NOTE — Telephone Encounter (Signed)
Requested medication (s) are due for refill today: Yes  Requested medication (s) are on the active medication list: No  Last refill:    Future visit scheduled: Yes  Notes to clinic: See notes, patient requesting refill due to BP     Requested Prescriptions  Pending Prescriptions Disp Refills   hydrochlorothiazide (HYDRODIURIL) 25 MG tablet [Pharmacy Med Name: HYDROCHLOROTHIAZIDE 25 MG TAB] 30 tablet 3    Sig: TAKE 1 TABLET BY MOUTH DAILY      Cardiovascular: Diuretics - Thiazide Passed - 09/15/2019  4:45 PM      Passed - Ca in normal range and within 360 days    Calcium  Date Value Ref Range Status  03/17/2019 9.6 8.7 - 10.2 mg/dL Final          Passed - Cr in normal range and within 360 days    Creatinine, Ser  Date Value Ref Range Status  03/17/2019 0.86 0.57 - 1.00 mg/dL Final          Passed - K in normal range and within 360 days    Potassium  Date Value Ref Range Status  03/17/2019 3.6 3.5 - 5.2 mmol/L Final          Passed - Na in normal range and within 360 days    Sodium  Date Value Ref Range Status  03/17/2019 136 134 - 144 mmol/L Final          Passed - Last BP in normal range    BP Readings from Last 1 Encounters:  07/18/19 109/74          Passed - Valid encounter within last 6 months    Recent Outpatient Visits           1 month ago Essential hypertension   [facility] [place], Dionne Bucy, MD   6 months ago Essential hypertension   [place], Dionne Bucy, MD       Future Appointments             In 1 week Bacigalupo, Dionne Bucy, MD [facility], [place]

## 2019-09-15 NOTE — Telephone Encounter (Signed)
Copied from [place] [id number]. Topic: General - Other >> Sep 15, 2019  4:26 PM Keene Breath wrote: Reason for CRM: Patient called to inform the doctor that her BP has been elevating and she would like to go back on her BP medication.  Please advise and call patient to discuss at [phone]

## 2019-09-16 MED ORDER — HYDROCHLOROTHIAZIDE 12.5 MG PO CAPS: 12.5000 mg | ORAL_CAPSULE | Freq: Every day | ORAL | 1 refills | Status: DC

## 2019-09-16 NOTE — Telephone Encounter (Signed)
Patient advised.

## 2019-09-16 NOTE — Telephone Encounter (Signed)
Ok to send in HCTZ 12.5mg  daily (this is what she was previously taking) #90 r1

## 2019-09-25 ENCOUNTER — Ambulatory Visit: Payer: BC Managed Care – PPO | Admitting: Family Medicine

## 2019-09-25 ENCOUNTER — Other Ambulatory Visit: Payer: Self-pay

## 2019-09-25 ENCOUNTER — Encounter: Payer: Self-pay | Admitting: Family Medicine

## 2019-09-25 VITALS — BP 126/76 | HR 71 | Temp 97.9°F | Wt 139.0 lb

## 2019-09-25 DIAGNOSIS — Z1159 Encounter for screening for other viral diseases: Secondary | ICD-10-CM

## 2019-09-25 DIAGNOSIS — I10 Essential (primary) hypertension: Secondary | ICD-10-CM

## 2019-09-25 DIAGNOSIS — Z114 Encounter for screening for human immunodeficiency virus [HIV]: Secondary | ICD-10-CM | POA: Diagnosis not present

## 2019-09-25 DIAGNOSIS — Z0000 Encounter for general adult medical examination without abnormal findings: Secondary | ICD-10-CM | POA: Diagnosis not present

## 2019-09-25 NOTE — Patient Instructions (Signed)

## 2019-09-25 NOTE — Progress Notes (Signed)
I,Laura E Walsh,acting as a scribe for Lavon Paganini, MD.,have documented all relevant documentation on the behalf of Lavon Paganini, MD,as directed by  Lavon Paganini, MD while in the presence of Lavon Paganini, MD.  Complete physical exam   Patient: Anita Cowan   DOB: 12-23-1970   49 y.o. Female  MRN: [id number] Visit Date: 09/25/2019  Today's healthcare provider: Lavon Paganini, MD   Chief Complaint  Patient presents with  . Hypertension   Subjective    Anita Cowan is a 49 y.o. female who presents today for a complete physical exam.  She reports consuming a general diet.  She generally feels well. She reports sleeping well. She does not have additional problems to discuss today.  HPI   She had stopped her HCTZ after our last visit due to low normal blood pressure.  With decreased walking due to knee injury, blood pressure elevated again.  She is now back on HCTZ 12.5 mg daily with good compliance and no side effects.  Home blood pressures have been well controlled.  She is hoping to improve her exercising again and get back off of HCTZ in the future.  Past Medical History:  Diagnosis Date  . BRCA negative 03/2019   IBIS=12.8%/riskscore=18.9%  . Breast microcalcifications   . Essential hypertension   . Family history of breast cancer   . Kidney stones   . Lower extremity edema   . Ovarian cyst    Past Surgical History:  Procedure Laterality Date  . CHOLECYSTECTOMY, LAPAROSCOPIC    . KNEE ARTHROSCOPY Right 1989  . TONSILLECTOMY    . TUBAL LIGATION     Social History   Socioeconomic History  . Marital status: Married    Spouse name: Not on file  . Number of children: 3  . Years of education: Not on file  . Highest education level: Not on file  Occupational History  . Occupation: Pharmacist, hospital  Tobacco Use  . Smoking status: Never Smoker  . Smokeless tobacco: Never Used  Vaping Use  . Vaping Use: Never used  Substance and Sexual Activity  .  Alcohol use: Never  . Drug use: Never  . Sexual activity: Yes    Partners: Male    Birth control/protection: Surgical    Comment: Tubal ligation  Other Topics Concern  . Not on file  Social History Narrative  . Not on file   Social Determinants of Health   Financial Resource Strain:   . Difficulty of Paying Living Expenses: Not on file  Food Insecurity:   . Worried About Charity fundraiser in the Last Year: Not on file  . Ran Out of Food in the Last Year: Not on file  Transportation Needs:   . Lack of Transportation (Medical): Not on file  . Lack of Transportation (Non-Medical): Not on file  Physical Activity:   . Days of Exercise per Week: Not on file  . Minutes of Exercise per Session: Not on file  Stress:   . Feeling of Stress : Not on file  Social Connections:   . Frequency of Communication with Friends and Family: Not on file  . Frequency of Social Gatherings with Friends and Family: Not on file  . Attends Religious Services: Not on file  . Active Member of Clubs or Organizations: Not on file  . Attends Archivist Meetings: Not on file  . Marital Status: Not on file  Intimate Partner Violence:   . Fear of Current or Ex-Partner: Not  on file  . Emotionally Abused: Not on file  . Physically Abused: Not on file  . Sexually Abused: Not on file   Family Status  Relation Name Status  . Mother  Alive  . Sister  Alive  . MGM  Deceased  . Mat Uncle  Deceased  . Mat Nordstrom  . Mat Uncle  Deceased  . Neg Hx  (Not Specified)   Family History  Problem Relation Age of Onset  . Hypertension Mother   . Lupus Mother        discoid  . Cervical cancer Mother   . Breast cancer Sister 11  . Thyroid nodules Sister        thyroid tumor in her teens  . Lung cancer Maternal Grandmother        smoker  . Diabetes Maternal Uncle   . Breast cancer Maternal Uncle        twice  . Bladder Cancer Maternal Uncle   . Lung cancer Maternal Uncle   . Colon cancer Neg Hx     Allergies  Allergen Reactions  . Azithromycin   . Codeine   . Penicillins   . Sulfa Antibiotics   . Tramadol   . Wheat Extract     Other reaction(s): GI Intolerance    Patient Care Team: Virginia Crews, MD as PCP - General (Family Medicine)   Medications: Outpatient Medications Prior to Visit  Medication Sig  . Apple Cid Vn-Grn Tea-Bit Or-Cr (APPLE CIDER VINEGAR PLUS PO) Take by mouth.  . cholecalciferol (VITAMIN D3) 25 MCG (1000 UNIT) tablet Take 1,000 Units by mouth daily.  . Collagen Hydrolysate POWD by Does not apply route.  Marland Kitchen ELDERBERRY PO Take by mouth.  . hydrochlorothiazide (MICROZIDE) 12.5 MG capsule Take 1 capsule (12.5 mg total) by mouth daily.  . Multiple Vitamin (MULTIVITAMIN) capsule Take 1 capsule by mouth daily.  . Turmeric (QC TUMERIC COMPLEX PO) Take by mouth.  . vitamin B-12 (CYANOCOBALAMIN) 500 MCG tablet Take 500 mcg by mouth daily.   No facility-administered medications prior to visit.    Review of Systems    Objective    BP 126/76 (BP Location: Right Arm, Patient Position: Sitting, Cuff Size: Large)   Pulse 71   Temp 97.9 F (36.6 C) (Oral)   Wt 139 lb (63 kg)   SpO2 97%   BMI 22.44 kg/m    Physical Exam Constitutional:      Appearance: Normal appearance.  HENT:     Head: Normocephalic and atraumatic.     Right Ear: Tympanic membrane, ear canal and external ear normal.     Left Ear: Tympanic membrane, ear canal and external ear normal.  Eyes:     Extraocular Movements: Extraocular movements intact.     Conjunctiva/sclera: Conjunctivae normal.     Pupils: Pupils are equal, round, and reactive to light.  Cardiovascular:     Rate and Rhythm: Normal rate and regular rhythm.     Pulses: Normal pulses.     Heart sounds: Normal heart sounds. No murmur heard.   Pulmonary:     Effort: Pulmonary effort is normal. No respiratory distress.     Breath sounds: Normal breath sounds. No wheezing.  Abdominal:     Palpations: Abdomen is  soft.     Tenderness: There is no abdominal tenderness.  Musculoskeletal:     Cervical back: Neck supple.     Right lower leg: No edema.     Left lower leg: No  edema.  Lymphadenopathy:     Cervical: No cervical adenopathy.  Skin:    General: Skin is warm and dry.     Findings: No rash.  Neurological:     Mental Status: She is alert and oriented to person, place, and time. Mental status is at baseline.  Psychiatric:        Mood and Affect: Mood normal.        Behavior: Behavior normal.       Last depression screening scores PHQ 2/9 Scores 03/03/2019  PHQ - 2 Score 0  PHQ- 9 Score 0   Last fall risk screening Fall Risk  03/03/2019  Falls in the past year? 0  Number falls in past yr: 0  Injury with Fall? 0  Follow up Falls evaluation completed   Last Audit-C alcohol use screening Alcohol Use Disorder Test (AUDIT) 03/03/2019  1. How often do you have a drink containing alcohol? 0  2. How many drinks containing alcohol do you have on a typical day when you are drinking? 0  3. How often do you have six or more drinks on one occasion? 0  AUDIT-C Score 0   A score of 3 or more in women, and 4 or more in men indicates increased risk for alcohol abuse, EXCEPT if all of the points are from question 1   No results found for any visits on 09/25/19.  Assessment & Plan    Routine Health Maintenance and Physical Exam  Exercise Activities and Dietary recommendations Goals   None      There is no immunization history on file for this patient.  Health Maintenance  Topic Date Due  . Hepatitis C Screening  Never done  . HIV Screening  Never done  . TETANUS/TDAP  Never done  . INFLUENZA VACCINE  04/22/2020 (Originally 08/24/2019)  . PAP SMEAR-Modifier  03/18/2024    Discussed health benefits of physical activity, and encouraged her to engage in regular exercise appropriate for her age and condition.  Problem List Items Addressed This Visit      Cardiovascular and Mediastinum    Essential hypertension    Well controlled Continue current medications Recheck metabolic panel F/u in 6 months       Relevant Orders   Basic Metabolic Panel (BMET)    Other Visit Diagnoses    Annual physical exam    -  Primary   Relevant Orders   Basic Metabolic Panel (BMET)   Need for hepatitis C screening test       Relevant Orders   Hepatitis C Antibody   Encounter for screening for HIV       Relevant Orders   HIV antibody (with reflex)       Return in about 6 months (around 03/24/2020) for For hypertension.     I, Lavon Paganini, MD, have reviewed all documentation for this visit. The documentation on 09/25/19 for the exam, diagnosis, procedures, and orders are all accurate and complete.   , Dionne Bucy, MD, MPH [facility]

## 2019-09-25 NOTE — Assessment & Plan Note (Signed)
Well controlled Continue current medications Recheck metabolic panel F/u in 6 months  

## 2019-10-28 ENCOUNTER — Telehealth: Payer: Self-pay

## 2019-10-28 LAB — BASIC METABOLIC PANEL
BUN/Creatinine Ratio: 21 (ref 9–23)
BUN: 15 mg/dL (ref 6–24)
CO2: 24 mmol/L (ref 20–29)
Calcium: 9.1 mg/dL (ref 8.7–10.2)
Chloride: 102 mmol/L (ref 96–106)
Creatinine, Ser: 0.73 mg/dL (ref 0.57–1.00)
GFR calc Af Amer: 113 mL/min/{1.73_m2} (ref >59)
GFR calc non Af Amer: 98 mL/min/{1.73_m2} (ref >59)
Glucose: 85 mg/dL (ref 65–99)
Potassium: 3.8 mmol/L (ref 3.5–5.2)
Sodium: 140 mmol/L (ref 134–144)

## 2019-10-28 LAB — HEPATITIS C ANTIBODY: Hep C Virus Ab: <0.1 s/co ratio (ref 0.0–0.9)

## 2019-10-28 LAB — HIV ANTIBODY (ROUTINE TESTING W REFLEX): HIV Screen 4th Generation wRfx: NONREACTIVE

## 2019-10-28 NOTE — Telephone Encounter (Signed)
-----   Message from Mar Daring, [place] sent at 10/28/2019  9:03 AM EDT ----- Good Morning,  I am reviewing labs for Dr. Brita Romp as she is out of town.  HIV screen is negative. Hepatitis C screen is negative. Kidney function is normal. Sodium, potassium, and calcium are normal. Sugar is normal.

## 2019-10-28 NOTE — Telephone Encounter (Signed)
Written by Mar Daring, PA-C on 10/28/2019 9:03 AM EDT  Seen by patient Anita Cowan on 10/28/2019 9:23 AM

## 2020-03-18 ENCOUNTER — Other Ambulatory Visit: Payer: Self-pay | Admitting: Family Medicine

## 2020-03-26 NOTE — Progress Notes (Signed)
Established patient visit   Patient: Anita Cowan   DOB: 1970-04-03   50 y.o. Female  MRN: [id number] Visit Date: 03/29/2020  Today's healthcare provider: Lavon Paganini, MD   Chief Complaint  Patient presents with  . Hypertension   Subjective    HPI  Hypertension, follow-up  BP Readings from Last 3 Encounters:  03/29/20 126/89  09/25/19 126/76  07/18/19 109/74   Wt Readings from Last 3 Encounters:  03/29/20 146 lb 14.4 oz (66.6 kg)  09/25/19 139 lb (63 kg)  07/18/19 138 lb (62.6 kg)     She was last seen for hypertension 6 months ago.  BP at that visit was 126/76. Management since that visit includes no changes.  She reports excellent compliance with treatment. She is not having side effects.  She is following a Regular diet. She is exercising. She does not smoke.  Use of agents associated with hypertension: none.   Outside blood pressures are not being checked. Symptoms: No chest pain No chest pressure  No palpitations No syncope  No dyspnea No orthopnea  No paroxysmal nocturnal dyspnea Yes lower extremity edema   Pertinent labs: Lab Results  Component Value Date   CHOL 188 03/17/2019   HDL 48 03/17/2019   LDLCALC 123 (H) 03/17/2019   TRIG 93 03/17/2019   CHOLHDL 3.9 03/17/2019   Lab Results  Component Value Date   NA 140 10/27/2019   K 3.8 10/27/2019   CREATININE 0.73 10/27/2019   GFRNONAA 98 10/27/2019   GFRAA 113 10/27/2019   GLUCOSE 85 10/27/2019     The 10-year ASCVD risk score Mikey Bussing DC Jr., et al., 2013) is: 1.5%   ---------------------------------------------------------------------------------------------------   Patient Active Problem List   Diagnosis Date Noted  . Moderate mixed hyperlipidemia not requiring statin therapy 03/29/2020  . Witnessed episode of apnea 07/18/2019  . Family history of breast cancer 03/03/2019  . Essential hypertension   . Lower extremity edema    Social History   Tobacco Use  . Smoking  status: Never Smoker  . Smokeless tobacco: Never Used  Vaping Use  . Vaping Use: Never used  Substance Use Topics  . Alcohol use: Never  . Drug use: Never   Allergies  Allergen Reactions  . Azithromycin   . Codeine   . Penicillins   . Sulfa Antibiotics   . Tramadol   . Wheat Extract     Other reaction(s): GI Intolerance       Medications: Outpatient Medications Prior to Visit  Medication Sig  . Apple Cid Vn-Grn Tea-Bit Or-Cr (APPLE CIDER VINEGAR PLUS PO) Take by mouth.  . cholecalciferol (VITAMIN D3) 25 MCG (1000 UNIT) tablet Take 1,000 Units by mouth daily.  . Collagen Hydrolysate POWD by Does not apply route.  Marland Kitchen ELDERBERRY PO Take by mouth.  . hydrochlorothiazide (MICROZIDE) 12.5 MG capsule TAKE 1 CAPSULE BY MOUTH EVERY DAY  . Multiple Vitamin (MULTIVITAMIN) capsule Take 1 capsule by mouth daily.  . Turmeric (QC TUMERIC COMPLEX PO) Take by mouth.  . vitamin B-12 (CYANOCOBALAMIN) 500 MCG tablet Take 500 mcg by mouth daily.   No facility-administered medications prior to visit.    Review of Systems  Constitutional: Negative.   Respiratory: Negative.   Cardiovascular: Positive for leg swelling.    Last CBC Lab Results  Component Value Date   WBC 6.8 03/17/2019   HGB 15.0 03/17/2019   HCT 45.1 03/17/2019   MCV 90 03/17/2019   MCH 30.1 03/17/2019   RDW 13.0  03/17/2019   PLT 333 03/17/2019   Last thyroid functions No results found for: TSH, T3TOTAL, T4TOTAL, THYROIDAB Last vitamin D Lab Results  Component Value Date   VD25OH 48.8 03/17/2019   Last vitamin B12 and Folate Lab Results  Component Value Date   VITAMINB12 1,917 (H) 03/17/2019        Objective    BP 126/89 (BP Location: Left Arm, Patient Position: Sitting, Cuff Size: Normal)   Pulse 71   Temp 98 F (36.7 C) (Oral)   Resp 16   Ht 5\' 6"  (1.676 m)   Wt 146 lb 14.4 oz (66.6 kg)   LMP 03/26/2020 (Exact Date)   SpO2 96%   BMI 23.71 kg/m  BP Readings from Last 3 Encounters:  03/29/20  126/89  09/25/19 126/76  07/18/19 109/74   Wt Readings from Last 3 Encounters:  03/29/20 146 lb 14.4 oz (66.6 kg)  09/25/19 139 lb (63 kg)  07/18/19 138 lb (62.6 kg)      Physical Exam Vitals reviewed.  Constitutional:      General: She is not in acute distress.    Appearance: Normal appearance. She is well-developed. She is not diaphoretic.  HENT:     Head: Normocephalic and atraumatic.  Eyes:     General: No scleral icterus.    Conjunctiva/sclera: Conjunctivae normal.  Neck:     Thyroid: No thyromegaly.  Cardiovascular:     Rate and Rhythm: Normal rate and regular rhythm.     Pulses: Normal pulses.     Heart sounds: Normal heart sounds. No murmur heard.   Pulmonary:     Effort: Pulmonary effort is normal. No respiratory distress.     Breath sounds: Normal breath sounds. No wheezing, rhonchi or rales.  Musculoskeletal:     Cervical back: Neck supple.     Right lower leg: No edema.     Left lower leg: No edema.  Lymphadenopathy:     Cervical: No cervical adenopathy.  Skin:    General: Skin is warm and dry.     Findings: No rash.  Neurological:     Mental Status: She is alert and oriented to person, place, and time. Mental status is at baseline.  Psychiatric:        Mood and Affect: Mood normal.        Behavior: Behavior normal.       No results found for any visits on 03/29/20.  Assessment & Plan     Problem List Items Addressed This Visit      Cardiovascular and Mediastinum   Essential hypertension - Primary    Initially elevated Typically well controlled Continue HCTZ at current dose Recheck metabolic panel F/u in 72m      Relevant Orders   Comprehensive metabolic panel     Other   Moderate mixed hyperlipidemia not requiring statin therapy    Reviewed last lipid panel Not currently on a statin Recheck FLP and CMP Discussed diet and exercise       Relevant Orders   Comprehensive metabolic panel   Lipid panel       Return in about 6  months (around 09/29/2020) for CPE.      I, Lavon Paganini, MD, have reviewed all documentation for this visit. The documentation on 03/29/20 for the exam, diagnosis, procedures, and orders are all accurate and complete.   , Dionne Bucy, MD, MPH [facility]

## 2020-03-26 NOTE — Patient Instructions (Signed)

## 2020-03-29 ENCOUNTER — Encounter: Payer: Self-pay | Admitting: Family Medicine

## 2020-03-29 ENCOUNTER — Ambulatory Visit: Payer: BC Managed Care – PPO | Admitting: Family Medicine

## 2020-03-29 ENCOUNTER — Other Ambulatory Visit: Payer: Self-pay

## 2020-03-29 VITALS — BP 126/89 | HR 71 | Temp 98.0°F | Resp 16 | Ht 66.0 in | Wt 146.9 lb

## 2020-03-29 DIAGNOSIS — E782 Mixed hyperlipidemia: Secondary | ICD-10-CM | POA: Insufficient documentation

## 2020-03-29 DIAGNOSIS — I10 Essential (primary) hypertension: Secondary | ICD-10-CM

## 2020-03-29 NOTE — Assessment & Plan Note (Signed)
Reviewed last lipid panel Not currently on a statin Recheck FLP and CMP Discussed diet and exercise  

## 2020-03-29 NOTE — Assessment & Plan Note (Signed)
Initially elevated Typically well controlled Continue HCTZ at current dose Recheck metabolic panel F/u in 35m

## 2020-03-30 LAB — LIPID PANEL
Chol/HDL Ratio: 3.1 ratio (ref 0.0–4.4)
Cholesterol, Total: 225 mg/dL — ABNORMAL HIGH (ref 100–199)
HDL: 72 mg/dL (ref >39)
LDL Chol Calc (NIH): 142 mg/dL — ABNORMAL HIGH (ref 0–99)
Triglycerides: 66 mg/dL (ref 0–149)
VLDL Cholesterol Cal: 11 mg/dL (ref 5–40)

## 2020-03-30 LAB — COMPREHENSIVE METABOLIC PANEL
ALT: 11 IU/L (ref 0–32)
AST: 15 IU/L (ref 0–40)
Albumin/Globulin Ratio: 2.1 (ref 1.2–2.2)
Albumin: 4.6 g/dL (ref 3.8–4.8)
Alkaline Phosphatase: 43 IU/L — ABNORMAL LOW (ref 44–121)
BUN/Creatinine Ratio: 19 (ref 9–23)
BUN: 15 mg/dL (ref 6–24)
Bilirubin Total: 0.2 mg/dL (ref 0.0–1.2)
CO2: 22 mmol/L (ref 20–29)
Calcium: 9 mg/dL (ref 8.7–10.2)
Chloride: 102 mmol/L (ref 96–106)
Creatinine, Ser: 0.77 mg/dL (ref 0.57–1.00)
Globulin, Total: 2.2 g/dL (ref 1.5–4.5)
Glucose: 89 mg/dL (ref 65–99)
Potassium: 3.9 mmol/L (ref 3.5–5.2)
Sodium: 141 mmol/L (ref 134–144)
Total Protein: 6.8 g/dL (ref 6.0–8.5)
eGFR: 95 mL/min/{1.73_m2} (ref >59)

## 2020-04-19 ENCOUNTER — Encounter: Payer: Self-pay | Admitting: Family Medicine

## 2020-04-20 NOTE — Telephone Encounter (Signed)
Increase HCTZ to 25mg  daily and schedule f/u in 37m

## 2020-05-20 ENCOUNTER — Ambulatory Visit: Payer: Self-pay | Admitting: Obstetrics and Gynecology

## 2020-05-24 NOTE — Progress Notes (Signed)
PCP:  Virginia Crews, MD   Chief Complaint  Patient presents with  . Gynecologic Exam  . Pelvic Pain    Left side only x years     HPI:      Ms. Anita Cowan is a 50 y.o. No obstetric history on file. who LMP was Patient's last menstrual period was 05/16/2020 (exact date)., presents today for her annual examination.  Her menses are regular every 28-30 days, lasting 6-7 days. Heavy for several days, changing ultra tampon Q2-3 hrs. Dysmenorrhea moderate to severe, improved with tylenol, essential oils and heating pad. Had LLQ pain mid cycle and with menses last yr, with LT corpus luteal cyst on GYN u/s. Hx of cysts in past. Pain is worse now and is having to miss work during menses. Has excruciating LLQ pain with BMs during menses only. Has less severe LLQ pain with ovulation. Sx started after TL years ago. She does not have intermenstrual bleeding. Does not want hormones, hx of HTN. Is interested in ablation for menorrhagia.  Sex activity: single partner, contraception - tubal ligation. No postcoital bleeding/pain. Last Pap: 03/19/19 Results were normal/neg HPV DNA. No hx of abn paps. Hx of STDs: none  Last mammogram: 05/05/19 Results were normal. Has been followed for LT breast microcalcifications for a few yrs. There is a FH of breast cancer in her sister and mat uncle twice. Her sister is BRCA neg 2 yrs ago but didn't do panel testing. Pt is MyRisk neg 3/21. IBIS=12.8%/riskscore =18.9%. There is no FH of ovarian cancer. The patient does do self-breast exams.  Tobacco use: The patient denies current or previous tobacco use. Alcohol use: none No drug use.  Exercise: moderately active  Colonoscopy: never  She does get adequate calcium and Vitamin D in her diet.  Labs with PCP  Past Medical History:  Diagnosis Date  . BRCA negative 03/2019   IBIS=12.8%/riskscore=18.9%  . Breast microcalcifications   . Essential hypertension   . Family history of breast cancer   .  Kidney stones   . Lower extremity edema   . Ovarian cyst     Past Surgical History:  Procedure Laterality Date  . CHOLECYSTECTOMY, LAPAROSCOPIC    . KNEE ARTHROSCOPY Right 1989  . TONSILLECTOMY    . TUBAL LIGATION      Family History  Problem Relation Age of Onset  . Hypertension Mother   . Lupus Mother        discoid  . Cervical cancer Mother   . Breast cancer Sister 38  . Thyroid nodules Sister        thyroid tumor in her teens  . Lung cancer Maternal Grandmother        smoker  . Diabetes Maternal Uncle   . Breast cancer Maternal Uncle        twice  . Bladder Cancer Maternal Uncle   . Lung cancer Maternal Uncle   . Colon cancer Neg Hx     Social History   Socioeconomic History  . Marital status: Married    Spouse name: Not on file  . Number of children: 3  . Years of education: Not on file  . Highest education level: Not on file  Occupational History  . Occupation: Pharmacist, hospital  Tobacco Use  . Smoking status: Never Smoker  . Smokeless tobacco: Never Used  Vaping Use  . Vaping Use: Never used  Substance and Sexual Activity  . Alcohol use: Never  . Drug use: Never  . Sexual  activity: Yes    Partners: Male    Birth control/protection: Surgical    Comment: Tubal ligation  Other Topics Concern  . Not on file  Social History Narrative  . Not on file   Social Determinants of Health   Financial Resource Strain: Not on file  Food Insecurity: Not on file  Transportation Needs: Not on file  Physical Activity: Not on file  Stress: Not on file  Social Connections: Not on file  Intimate Partner Violence: Not on file     Current Outpatient Medications:  .  Apple Cid Vn-Grn Tea-Bit Or-Cr (APPLE CIDER VINEGAR PLUS PO), Take by mouth., Disp: , Rfl:  .  cholecalciferol (VITAMIN D3) 25 MCG (1000 UNIT) tablet, Take 1,000 Units by mouth daily., Disp: , Rfl:  .  ELDERBERRY PO, Take by mouth., Disp: , Rfl:  .  hydrochlorothiazide (MICROZIDE) 12.5 MG capsule, TAKE 1  CAPSULE BY MOUTH EVERY DAY, Disp: 90 capsule, Rfl: 1 .  Multiple Vitamin (MULTIVITAMIN) capsule, Take 1 capsule by mouth daily., Disp: , Rfl:  .  Turmeric (QC TUMERIC COMPLEX PO), Take by mouth., Disp: , Rfl:      ROS:  Review of Systems  Constitutional: Negative for fatigue, fever and unexpected weight change.  Respiratory: Negative for cough, shortness of breath and wheezing.   Cardiovascular: Negative for chest pain, palpitations and leg swelling.  Gastrointestinal: Negative for blood in stool, constipation, diarrhea, nausea and vomiting.  Endocrine: Negative for cold intolerance, heat intolerance and polyuria.  Genitourinary: Positive for pelvic pain. Negative for dyspareunia, dysuria, flank pain, frequency, genital sores, hematuria, menstrual problem, urgency, vaginal bleeding, vaginal discharge and vaginal pain.  Musculoskeletal: Negative for back pain, joint swelling and myalgias.  Skin: Negative for rash.  Neurological: Negative for dizziness, syncope, light-headedness, numbness and headaches.  Hematological: Negative for adenopathy.  Psychiatric/Behavioral: Negative for agitation, confusion, sleep disturbance and suicidal ideas. The patient is not nervous/anxious.   BREAST: No symptoms   Objective: BP 110/80   Ht '5\' 6"'  (1.676 m)   Wt 149 lb (67.6 kg)   LMP 05/16/2020 (Exact Date)   BMI 24.05 kg/m    Physical Exam Constitutional:      Appearance: She is well-developed.  Genitourinary:     Vulva normal.     Right Labia: No rash, tenderness or lesions.    Left Labia: No tenderness, lesions or rash.    No vaginal discharge, erythema or tenderness.      Right Adnexa: not tender and no mass present.    Left Adnexa: not tender and no mass present.    No cervical friability or polyp.     Uterus is not enlarged or tender.  Breasts:     Right: No mass, nipple discharge, skin change or tenderness.     Left: No mass, nipple discharge, skin change or tenderness.     Neck:     Thyroid: No thyromegaly.  Cardiovascular:     Rate and Rhythm: Normal rate and regular rhythm.     Heart sounds: Normal heart sounds. No murmur heard.   Pulmonary:     Effort: Pulmonary effort is normal.     Breath sounds: Normal breath sounds.  Abdominal:     Palpations: Abdomen is soft.     Tenderness: There is no abdominal tenderness. There is no guarding or rebound.  Musculoskeletal:        General: Normal range of motion.     Cervical back: Normal range of motion.  Lymphadenopathy:  Cervical: No cervical adenopathy.  Neurological:     General: No focal deficit present.     Mental Status: She is alert and oriented to person, place, and time.     Cranial Nerves: No cranial nerve deficit.  Skin:    General: Skin is warm and dry.  Psychiatric:        Mood and Affect: Mood normal.        Behavior: Behavior normal.        Thought Content: Thought content normal.        Judgment: Judgment normal.  Vitals reviewed.    Assessment/Plan:  Encounter for annual routine gynecological examination  Encounter for screening mammogram for malignant neoplasm of breast - Plan: MM 3D SCREEN BREAST BILATERAL; pt to sched mammo.   Family history of breast cancer - Pt is MyRisk neg. No increased screening at this time.   LLQ pain - Hx of ovar cysts but pain severe with menses. Sx started after TL years ago. F/u with MD. Would like ablation for menorrhagia. Question issue with LT fallopian tube due to TL.   Menorrhagia--pt declines hormones, can't have estrogen. Would like endometrial ablation. Will sched appt with MD.   Screening for colon cancer--colonoscopy/cologuard discussed. Pt to consider and f/u for ref.            GYN counsel breast self exam, mammography screening, adequate intake of calcium and vitamin D, diet and exercise     F/U  Return in about 1 year (around 05/25/2021).   B. , PA-C 05/25/2020 4:25 PM

## 2020-05-25 ENCOUNTER — Encounter: Payer: Self-pay | Admitting: Obstetrics and Gynecology

## 2020-05-25 ENCOUNTER — Other Ambulatory Visit: Payer: Self-pay

## 2020-05-25 ENCOUNTER — Ambulatory Visit (INDEPENDENT_AMBULATORY_CARE_PROVIDER_SITE_OTHER): Payer: BC Managed Care – PPO | Admitting: Obstetrics and Gynecology

## 2020-05-25 VITALS — BP 110/80 | Ht 66.0 in | Wt 149.0 lb

## 2020-05-25 DIAGNOSIS — Z01419 Encounter for gynecological examination (general) (routine) without abnormal findings: Secondary | ICD-10-CM

## 2020-05-25 DIAGNOSIS — Z1211 Encounter for screening for malignant neoplasm of colon: Secondary | ICD-10-CM | POA: Diagnosis not present

## 2020-05-25 DIAGNOSIS — Z1231 Encounter for screening mammogram for malignant neoplasm of breast: Secondary | ICD-10-CM

## 2020-05-25 DIAGNOSIS — Z803 Family history of malignant neoplasm of breast: Secondary | ICD-10-CM | POA: Diagnosis not present

## 2020-05-25 NOTE — Patient Instructions (Addendum)
I value your feedback and you entrusting us with your care. If you get a [facility] patient survey, I would appreciate you taking the time to let us know about your experience today. Thank you!  [facility]: [phone]      

## 2020-05-31 ENCOUNTER — Other Ambulatory Visit: Payer: Self-pay

## 2020-05-31 ENCOUNTER — Ambulatory Visit
Admission: RE | Admit: 2020-05-31 | Discharge: 2020-05-31 | Disposition: A | Discharge status: Home / Self Care | Payer: BC Managed Care – PPO | Source: Ambulatory Visit | Attending: Obstetrics and Gynecology | Admitting: Obstetrics and Gynecology

## 2020-05-31 DIAGNOSIS — Z803 Family history of malignant neoplasm of breast: Secondary | ICD-10-CM | POA: Diagnosis present

## 2020-05-31 DIAGNOSIS — Z1231 Encounter for screening mammogram for malignant neoplasm of breast: Secondary | ICD-10-CM | POA: Diagnosis present

## 2020-06-01 ENCOUNTER — Other Ambulatory Visit: Payer: Self-pay | Admitting: Obstetrics and Gynecology

## 2020-06-01 DIAGNOSIS — R928 Other abnormal and inconclusive findings on diagnostic imaging of breast: Secondary | ICD-10-CM

## 2020-06-02 ENCOUNTER — Other Ambulatory Visit: Payer: Self-pay

## 2020-06-02 ENCOUNTER — Ambulatory Visit: Payer: BC Managed Care – PPO | Admitting: Family Medicine

## 2020-06-02 VITALS — BP 137/101 | HR 76 | Ht 66.0 in | Wt 149.0 lb

## 2020-06-02 DIAGNOSIS — I10 Essential (primary) hypertension: Secondary | ICD-10-CM

## 2020-06-02 MED ORDER — LISINOPRIL 10 MG PO TABS: 10.0000 mg | ORAL_TABLET | Freq: Every day | ORAL | 0 refills | Status: DC

## 2020-06-02 MED ORDER — HYDROCHLOROTHIAZIDE 25 MG PO TABS: 25.0000 mg | ORAL_TABLET | Freq: Every morning | ORAL | 3 refills | Status: DC

## 2020-06-02 NOTE — Progress Notes (Signed)
Established patient visit   Patient: Anita Cowan   DOB: 27-Aug-1970   50 y.o. Female  MRN: [id number] Visit Date: 06/02/2020  Today's healthcare provider: Lelon Huh, MD   Chief Complaint  Patient presents with  . Hypertension   Subjective    HPI  Hypertension, follow-up  BP Readings from Last 3 Encounters:  06/02/20 120/80  05/25/20 110/80  03/29/20 126/89   Wt Readings from Last 3 Encounters:  06/02/20 149 lb (67.6 kg)  05/25/20 149 lb (67.6 kg)  03/29/20 146 lb 14.4 oz (66.6 kg)     She was last seen for hypertension 2 months ago.  BP at that visit was 126/89. Since then she reported via MyChart that her blood pressures were running 154/93 and was advised by Dr. B to double hctz dose to 25mg  daily. She reports excellent compliance with treatment. She is having side effects. Leg cramps sometimes. She is following a Regular diet. She is exercising. She does not smoke.  Use of agents associated with hypertension: none.   Outside blood pressures are all over the place -  130s/90s. Symptoms: No chest pain No chest pressure  Yes palpitations No syncope  No dyspnea No orthopnea  No paroxysmal nocturnal dyspnea Yes lower extremity edema    Pertinent labs: Lab Results  Component Value Date   CHOL 225 (H) 03/29/2020   HDL 72 03/29/2020   LDLCALC 142 (H) 03/29/2020   TRIG 66 03/29/2020   CHOLHDL 3.1 03/29/2020   Lab Results  Component Value Date   NA 141 03/29/2020   K 3.9 03/29/2020   CREATININE 0.77 03/29/2020   GFRNONAA 98 10/27/2019   GFRAA 113 10/27/2019   GLUCOSE 89 03/29/2020     The 10-year ASCVD risk score Mikey Bussing DC Jr., et al., 2013) is: 1%   ---------------------------------------------------------------------------------------------------      Medications: Outpatient Medications Prior to Visit  Medication Sig  . Apple Cid Vn-Grn Tea-Bit Or-Cr (APPLE CIDER VINEGAR PLUS PO) Take by mouth.  . cholecalciferol (VITAMIN D3) 25 MCG  (1000 UNIT) tablet Take 1,000 Units by mouth daily.  Marland Kitchen ELDERBERRY PO Take by mouth.  . hydrochlorothiazide (MICROZIDE) 12.5 MG capsule TAKE 1 CAPSULE BY MOUTH EVERY DAY  . Multiple Vitamin (MULTIVITAMIN) capsule Take 1 capsule by mouth daily.  . Turmeric (QC TUMERIC COMPLEX PO) Take by mouth.   No facility-administered medications prior to visit.    Review of Systems     Objective    BP (!) 137/101 (BP Location: Right Arm, Patient Position: Sitting, Cuff Size: Normal)   Pulse 76   Ht 5\' 6"  (1.676 m)   Wt 149 lb (67.6 kg)   LMP 05/16/2020 (Exact Date)   SpO2 100%   BMI 24.05 kg/m     Physical Exam   General appearance: Well developed, well nourished female, cooperative and in no acute distress Head: Normocephalic, without obvious abnormality, atraumatic Respiratory: Respirations even and unlabored, normal respiratory rate Extremities: All extremities are intact.  Skin: Skin color, texture, turgor normal. No rashes seen  Psych: Appropriate mood and affect. Neurologic: Mental status: Alert, oriented to person, place, and time, thought content appropriate.   Assessment & Plan     1. Essential hypertension Home systolic BP s better since doubling dose of hctz, but diastolic remain 08+ consistently.   Will add lisinopril (ZESTRIL) 10 MG tablet; Take 1 tablet (10 mg total) by mouth daily.  Dispense: 90 tablet; Refill: 0  She has had BTL and no intention  of future pregnancies. Counseled regarding potential adverse effects of ACEIs including angioedema and to stop and advise MD immediately if it develops.   Change hctz 2 x 12.5mg  capsules to  hydrochlorothiazide (HYDRODIURIL) 25 MG tablet; Take 1 tablet (25 mg total) by mouth in the morning.  Dispense: 90 tablet; Refill: 3  Follow up Dr. B in about a month.      The entirety of the information documented in the History of Present Illness, Review of Systems and Physical Exam were personally obtained by me. Portions of this  information were initially documented by the CMA and reviewed by me for thoroughness and accuracy.      Lelon Huh, MD  [facility] [phone] (phone) [phone] (fax)  [place]

## 2020-06-03 ENCOUNTER — Other Ambulatory Visit: Payer: Self-pay | Admitting: Internal Medicine

## 2020-06-04 ENCOUNTER — Other Ambulatory Visit: Payer: Self-pay

## 2020-06-04 ENCOUNTER — Ambulatory Visit
Admission: RE | Admit: 2020-06-04 | Discharge: 2020-06-04 | Disposition: A | Discharge status: Home / Self Care | Payer: BC Managed Care – PPO | Source: Ambulatory Visit | Attending: Obstetrics and Gynecology | Admitting: Obstetrics and Gynecology

## 2020-06-04 DIAGNOSIS — R928 Other abnormal and inconclusive findings on diagnostic imaging of breast: Secondary | ICD-10-CM | POA: Diagnosis present

## 2020-06-07 ENCOUNTER — Other Ambulatory Visit: Payer: Self-pay | Admitting: Obstetrics and Gynecology

## 2020-06-07 DIAGNOSIS — R928 Other abnormal and inconclusive findings on diagnostic imaging of breast: Secondary | ICD-10-CM

## 2020-06-09 ENCOUNTER — Ambulatory Visit (INDEPENDENT_AMBULATORY_CARE_PROVIDER_SITE_OTHER): Payer: BC Managed Care – PPO | Admitting: Obstetrics and Gynecology

## 2020-06-09 ENCOUNTER — Other Ambulatory Visit: Payer: Self-pay

## 2020-06-09 ENCOUNTER — Encounter: Payer: Self-pay | Admitting: Obstetrics and Gynecology

## 2020-06-09 VITALS — BP 124/76 | Ht 66.0 in | Wt 147.0 lb

## 2020-06-09 DIAGNOSIS — N939 Abnormal uterine and vaginal bleeding, unspecified: Secondary | ICD-10-CM

## 2020-06-09 NOTE — Progress Notes (Signed)
Gynecology Abnormal Uterine Bleeding Initial Evaluation   Chief Complaint:  Chief Complaint  Patient presents with  . ablation    Discuss ablation - RM 5    History of Present Illness:    Anita Cowan is a 50 y.o. [id number] who LMP was Patient's last menstrual period was 05/16/2020 (exact date)., presents today for a problem visit.  She complains of menorrhagia that  began several months ago and its severity is described as moderate.  The patient menstrual complaints are chronic present for the oast 6 months but with recent acute exacerbation.  Feels menses have been heavier every since tubal ligation.  She has regular periods every 28-30 days and they are associated with moderate menstrual cramping.  Sister has a diagnosis of breast cancer, patient has negative genetic testing herself.  Is currently awaiting result of breast biopsy. The patient is sexually active. She currently uses tubal ligationfor contraception.    Previous evaluation:  1) Normal TVUS 02/2019 2) CBC normal Hgb 15.0 on 03/17/2019 3) GYN cytology normal 03/20/2019   Paramter Normal / Abnormal Prsent  Frequency [place]hea     Infrequent (>38 days)     Normal (?24 days ?38 days) X   Freequent (<24 days)    Duration Normal (?8 days) X   Prolonged (>8 days)    Regularity Regular (shortest to longest cycle variation ?7-9 days)* X   Irregular (shortest to longest cycle variation ?8-10days)*    Flow Volume Light    (Self reported) Normal     Heavy X      Intermenstrual Bleeding None X   Random     Cyclical early     Cyclical mid     Cyclical late        Unscheduled Bleeding  Not applicable X  (exogenous hormones) Absent     Present     FIGO AUB I System: *The available evidence suggests that, using these criteria, the normal range (shortest to longest) varies with age: 52-25 y of age, ?52 d; 71-41 y, ?7 d; and for 55-45 y, ?9 d    Review of Systems: ROS  Past Medical History:  Patient Active Problem List    Diagnosis Date Noted  . Moderate mixed hyperlipidemia not requiring statin therapy 03/29/2020  . Witnessed episode of apnea 07/18/2019  . Family history of breast cancer 03/03/2019  . Essential hypertension   . Lower extremity edema     Past Surgical History:  Past Surgical History:  Procedure Laterality Date  . CHOLECYSTECTOMY, LAPAROSCOPIC    . KNEE ARTHROSCOPY Right 1989  . TONSILLECTOMY    . TUBAL LIGATION      Obstetric History: [id number]  Family History:  Family History  Problem Relation Age of Onset  . Hypertension Mother   . Lupus Mother        discoid  . Cervical cancer Mother   . Breast cancer Sister 59  . Thyroid nodules Sister        thyroid tumor in her teens  . Lung cancer Maternal Grandmother        smoker  . Diabetes Maternal Uncle   . Breast cancer Maternal Uncle        twice  . Bladder Cancer Maternal Uncle   . Lung cancer Maternal Uncle   . Colon cancer Neg Hx     Social History:  Social History   Socioeconomic History  . Marital status: Married    Spouse name: Not on file  .  Number of children: 3  . Years of education: Not on file  . Highest education level: Not on file  Occupational History  . Occupation: Pharmacist, hospital  Tobacco Use  . Smoking status: Never Smoker  . Smokeless tobacco: Never Used  Vaping Use  . Vaping Use: Never used  Substance and Sexual Activity  . Alcohol use: Never  . Drug use: Never  . Sexual activity: Yes    Partners: Male    Birth control/protection: Surgical    Comment: Tubal ligation  Other Topics Concern  . Not on file  Social History Narrative  . Not on file   Social Determinants of Health   Financial Resource Strain: Not on file  Food Insecurity: Not on file  Transportation Needs: Not on file  Physical Activity: Not on file  Stress: Not on file  Social Connections: Not on file  Intimate Partner Violence: Not on file    Allergies:  Allergies  Allergen Reactions  . Azithromycin   . Codeine   .  Penicillins   . Sulfa Antibiotics   . Tramadol   . Wheat Extract     Other reaction(s): GI Intolerance    Medications: Prior to Admission medications   Medication Sig Start Date End Date Taking? Authorizing Provider  Apple Cid Vn-Grn Tea-Bit Or-Cr (APPLE CIDER VINEGAR PLUS PO) Take by mouth.   Yes [provider]  cholecalciferol (VITAMIN D3) 25 MCG (1000 UNIT) tablet Take 1,000 Units by mouth daily.   Yes [provider]  ELDERBERRY PO Take by mouth.   Yes [provider]  hydrochlorothiazide (HYDRODIURIL) 25 MG tablet Take 1 tablet (25 mg total) by mouth in the morning. 06/02/20  Yes Fisher, Kirstie Peri, MD  lisinopril (ZESTRIL) 10 MG tablet Take 1 tablet (10 mg total) by mouth daily. 06/02/20  Yes Birdie Sons, MD  Multiple Vitamin (MULTIVITAMIN) capsule Take 1 capsule by mouth daily.   Yes [provider]  Turmeric (QC TUMERIC COMPLEX PO) Take by mouth.   Yes [provider]    Physical Exam Blood pressure 124/76, height 5\' 6"  (1.676 m), weight 147 lb (66.7 kg), last menstrual period 05/16/2020.  Patient's last menstrual period was 05/16/2020 (exact date).  General: NAD HEENT: normocephalic, anicteric Thyroid: no enlargement, no palpable nodules Pulmonary: No increased work of breathing Cardiovascular: RRR, distal pulses 2+ Abdomen: NABS, soft, non-tender, non-distended.  Umbilicus without lesions.  No hepatomegaly, splenomegaly or masses palpable. No evidence of hernia  Genitourinary:  External: Normal external female genitalia.  Normal urethral meatus, normal Bartholin's and Skene's glands.    Vagina: Normal vaginal mucosa, no evidence of prolapse.    Cervix: Grossly normal in appearance, no bleeding  Uterus: Non-enlarged, mobile, normal contour.  No CMT  Adnexa: ovaries non-enlarged, no adnexal masses  Rectal: deferred  Lymphatic: no evidence of inguinal lymphadenopathy Extremities: no edema, erythema, or tenderness Neurologic:  Grossly intact Psychiatric: mood appropriate, affect full  Female chaperone present for pelvic portions of the physical exam  TVUS 03/18/2020  Location: [facility]  Date of Service: 03/19/2019   Indications:Pelvic Pain Findings:  The uterus is retroverted and measures 9.2 x 6.3 x 5.4 cm. Echo texture is homogenous without evidence of focal masses. The Endometrium measures 15.7 mm.  Right Ovary measures 3.0 x 1.7 x 1.5 cm. It is normal in appearance. Left Ovary measures 3.2 x 2.5 x 2.3 cm. It is normal in appearance. There is a corpus luteal cyst in the left ovary that measures 18.7 x  16.6 x 20.2 mm Survey of the adnexa demonstrates no adnexal masses. There is no free fluid in the cul de sac.  Impression: 1. Normal uterus and cervix.  2. Normal ovaries bilaterally.  3. There is a corpus luteal cyst in the left ovary.   Recommendations: 1.Clinical correlation with the patient's History and Physical Exam.  Assessment: 50 y.o. [id number] with abnormal uterine bleeding  Plan: Problem List Items Addressed This Visit   None     1) Discussed management options for abnormal uterine bleeding including expectant, NSAIDs, tranexamic acid (Lysteda), oral progesterone (Provera, norethindrone, megace), Depo Provera, Levonorgestrel containing IUD, endometrial ablation (Novasure) or hysterectomy as definitive surgical management.  Discussed risks and benefits of each method.   Prior ultrasound normal, intervals normal.  Discussed average age on menopause 45 with variation between patient's though.  Would also say results of breast biopsy may change management options.  At present patient is interested in ablation.  She would like to avoid hormones including IUD.  The patient was counseled on the overall effectiveness of endometrial ablation in achieving amenorrhea.  She is aware that some patient may continue to have menstrual cycles although these are generally greatly reduced in flow.  In  addition she was quoted a failure rate for endometrial ablation of approximately 25% within the frist 4 years, but these failures may happen at any time during or after the initial 4 year postop period.  She is aware that pregnancy is contra-indicated in the setting of prior endometrial ablation, and that ablation itself does not confer any contraceptive benefit.  She will therefore need to continue to rely on some means of contraception following the procedure.  Although rare and generally confined to patient who have undergone prior tubal ligatoin, post-ablation tubal sterilizaton syndrome (PATSS) may also occur with no reliable incidence rates as the majority of published literature is limited to case reports.   Prior to being considered a candidate for Novasure ablation she will need to undergo endometrial biopsy to rule out endometrial hyperplasia or malignancy as the cause of her bleeding, have an up to date pap on record, and undergo transvaginal ultrasound to verify the absence of focal endometrial lesion which may need to be addressed prior to proceeding with ablation.  In addition she is aware that the device is limited for use in women with a normal uterine cavity and uterine leiomyomata <3cm in size.  If present leiomyomata may increase the long-term failure rate of the procedure.  In rare instances the presence of a uterine septum or arcuate uterus, which may not be readily apparent on preoperative ultrasound, may necessitate the procedure to be aborted.    Procedure: Endometrial ablation (CPT 530 222 0602)  Indication: Abnormal uterine bleeding in women of reproductive age (ICD-9-CM Codes 545 all, except 626.0, 626.1, 626.3, 626.5, 626. 7)  Confirmation of Indication: 1.  History of excessive uterine bleeding evidenced by either of the following: a.  Profuse bleeding or repetitive periods lasting for more than 8 days b. Anemia due to acute or chronic blood loss 2. Failure to find -on physical  examination uterine or cervical pathology that would cause abnormal bleeding 3.  Laboratory data a. No finding of endometrial neoplasia b. No malignancy found in cytologic studies of cervix 4.  No finding of remediable cause by hysteroscopy  Actions Prior to Procedure: 1.  Document counseling of patient regarding completion of childbearing 2. Consider patient's medical and psychologic risks concerning consequences of ablation and alternative modes of therapy 3.  Determine that hormone treatment (estrogen-progesterone) was not successful 4. Rule out a bleeding diathesis or use of medications that may cause bleeding  5.  Rule out pregnancy 6. Assess surgical risk from anemia and need for treatment 7.  Rule out atypical endometrial hyperplasia or invasive carcinoma of en-dometrium and cervix 8. Counsel patient regarding potential need for surgical sterilization 9. Counsel patient that cyclic menstrual bleeding of reduced volume may occur  Contraindication:  Desire to maintain fertility   Unless otherwise stated, each numbered and lettered item ( except contraindication) must be present.   2) A total of 15 minutes were spent in face-to-face contact with the patient during this encounter with over half of that time devoted to counseling and coordination of care.  3) Return if symptoms worsen or fail to improve.   Malachy Mood, MD, [facility], [facility] 06/09/2020, 5:18 PM

## 2020-06-10 ENCOUNTER — Ambulatory Visit
Admission: RE | Admit: 2020-06-10 | Discharge: 2020-06-10 | Disposition: A | Discharge status: Home / Self Care | Payer: BC Managed Care – PPO | Source: Ambulatory Visit | Attending: Obstetrics and Gynecology | Admitting: Obstetrics and Gynecology

## 2020-06-10 DIAGNOSIS — R928 Other abnormal and inconclusive findings on diagnostic imaging of breast: Secondary | ICD-10-CM

## 2020-06-10 HISTORY — PX: BREAST BIOPSY: SHX20

## 2020-06-11 LAB — SURGICAL PATHOLOGY

## 2020-06-18 ENCOUNTER — Telehealth: Payer: Self-pay

## 2020-06-18 NOTE — Telephone Encounter (Signed)
-----   Message from Georgana Curio sent at 06/10/2020  8:03 AM EDT ----- Regarding: FW: Surgery I think Dr. Georgianne Fick was supposed to send this to you. ----- Message ----- From: Malachy Mood, MD Sent: 06/09/2020   9:19 PM EDT To: Georgana Curio Subject: Surgery                                        Surgery Booking Request Patient Full Name:  Anita Cowan  MRN: [id number]  DOB: 05/14/1970  Surgeon: Malachy Mood, MD  Requested Surgery Date and Time: Sometime early June Primary Diagnosis AND Code: Abnormal uterine bleeding Secondary Diagnosis and Code:  Surgical Procedure: Hysteroscopy D&C with Ablation RNFA Requested?: No L&D Notification: No Admission Status: same day surgery Length of Surgery: 50 min Special Case Needs: No H&P: Yes 2 weeks prior to obtain endometrial biopsy Phone Interview???:  No Interpreter: No Medical Clearance:  No Special Scheduling Instructions: Yes H&P 2 week prior for endometrial biopsy Any known health/anesthesia issues, diabetes, sleep apnea, latex allergy, defibrillator/pacemaker?: No Acuity: P3   (P1 highest, P2 delay may cause harm, P3 low, elective gyn, P4 lowest)

## 2020-06-18 NOTE — Telephone Encounter (Signed)
Called patient to schedule hysteroscopy D&C w ablation with Staebler  DOS 6/16  H&P 6/8 @ 2:50 - endo biopsy    Pre-admit phone call appointment to be requested - date and time will be included on H&P paper work. Also all appointments will be updated on pt MyChart. Explained that this appointment has a call window. Based on the time scheduled will indicate if the call will be received within a 4 hour window before 1:00 or after.  Advised that pt may also receive calls from the hospital pharmacy and pre-service center.  Confirmed pt has [employer] as Chartered certified accountant. No secondary insurance.  I advised pt that there may be an issue with the time between the H&P/biopsy and DOS. She is going on vacation/anniversary celebration 6/24-30. I let her know that I would consult with AMS to make sure there was enough time to get biopsy results.

## 2020-06-30 ENCOUNTER — Other Ambulatory Visit: Payer: Self-pay

## 2020-06-30 ENCOUNTER — Ambulatory Visit (INDEPENDENT_AMBULATORY_CARE_PROVIDER_SITE_OTHER): Payer: BC Managed Care – PPO | Admitting: Obstetrics and Gynecology

## 2020-06-30 ENCOUNTER — Encounter: Payer: Self-pay | Admitting: Obstetrics and Gynecology

## 2020-06-30 ENCOUNTER — Other Ambulatory Visit (HOSPITAL_COMMUNITY)
Admission: RE | Admit: 2020-06-30 | Discharge: 2020-06-30 | Disposition: A | Discharge status: Home / Self Care | Payer: BC Managed Care – PPO | Source: Ambulatory Visit | Attending: Obstetrics and Gynecology | Admitting: Obstetrics and Gynecology

## 2020-06-30 VITALS — BP 120/72 | Wt 148.0 lb

## 2020-06-30 DIAGNOSIS — N939 Abnormal uterine and vaginal bleeding, unspecified: Secondary | ICD-10-CM

## 2020-06-30 NOTE — H&P (View-Only) (Signed)
Obstetrics & Gynecology Surgery H&P    Chief Complaint: Scheduled Surgery   History of Present Illness: Patient is a 50 y.o. [id number] presenting for scheduled hysteroscopy, D&C, ablation for the treatment or further evaluation of AUB.   Prior Treatments prior to proceeding with surgery include: medical management  Preoperative Pap: 03/19/2019 NILM HPV negative Preoperative Endometrial biopsy: obtained today Preoperative Ultrasound: 03/19/2019   Review of Systems:10 point review of systems  Past Medical History:  Patient Active Problem List   Diagnosis Date Noted  . Moderate mixed hyperlipidemia not requiring statin therapy 03/29/2020  . Witnessed episode of apnea 07/18/2019  . Family history of breast cancer 03/03/2019  . Essential hypertension   . Lower extremity edema     Past Surgical History:  Past Surgical History:  Procedure Laterality Date  . BREAST BIOPSY Left 06/10/2020   distortion, x marker, path pending  . CHOLECYSTECTOMY, LAPAROSCOPIC    . KNEE ARTHROSCOPY Right 1989  . TONSILLECTOMY    . TUBAL LIGATION      Family History:  Family History  Problem Relation Age of Onset  . Hypertension Mother   . Lupus Mother        discoid  . Cervical cancer Mother   . Breast cancer Sister 44  . Thyroid nodules Sister        thyroid tumor in her teens  . Lung cancer Maternal Grandmother        smoker  . Diabetes Maternal Uncle   . Breast cancer Maternal Uncle        twice  . Bladder Cancer Maternal Uncle   . Lung cancer Maternal Uncle   . Colon cancer Neg Hx     Social History:  Social History   Socioeconomic History  . Marital status: Married    Spouse name: Not on file  . Number of children: 3  . Years of education: Not on file  . Highest education level: Not on file  Occupational History  . Occupation: Pharmacist, hospital  Tobacco Use  . Smoking status: Never Smoker  . Smokeless tobacco: Never Used  Vaping Use  . Vaping Use: Never used  Substance and  Sexual Activity  . Alcohol use: Never  . Drug use: Never  . Sexual activity: Yes    Partners: Male    Birth control/protection: Surgical    Comment: Tubal ligation  Other Topics Concern  . Not on file  Social History Narrative  . Not on file   Social Determinants of Health   Financial Resource Strain: Not on file  Food Insecurity: Not on file  Transportation Needs: Not on file  Physical Activity: Not on file  Stress: Not on file  Social Connections: Not on file  Intimate Partner Violence: Not on file    Allergies:  Allergies  Allergen Reactions  . Codeine Hives  . Tramadol Hives  . Wheat Extract     Other reaction(s): GI Intolerance  . Azithromycin Rash  . Penicillins Rash  . Sulfa Antibiotics Rash    Medications: Prior to Admission medications   Medication Sig Start Date End Date Taking? Authorizing Provider  Apple Cid Vn-Grn Tea-Bit Or-Cr (APPLE CIDER VINEGAR PLUS PO) Take 1 each by mouth daily.    [provider]  Ashwagandha 500 MG CAPS Take 500 mg by mouth daily.    [provider]  Brimonidine Tartrate (LUMIFY) 0.025 % SOLN Place 1 drop into both eyes daily as needed (red eyes).    [provider]  cholecalciferol (VITAMIN D3) 25 MCG (1000 UNIT) tablet Take 1,000 Units by mouth daily.    [provider]  ELDERBERRY PO Take 1 each by mouth daily.    [provider]  hydrochlorothiazide (HYDRODIURIL) 25 MG tablet Take 1 tablet (25 mg total) by mouth in the morning. 06/02/20   Birdie Sons, MD  lisinopril (ZESTRIL) 10 MG tablet Take 1 tablet (10 mg total) by mouth daily. 06/02/20   Birdie Sons, MD  Turmeric 500 MG CAPS Take 500 mg by mouth daily.    [provider]    Physical Exam Vitals: Blood pressure 120/72, weight 148 lb (67.1 kg), last menstrual period 06/15/2020. General: NAD HEENT: normocephalic, anicteric Pulmonary: No increased work of breathing Genitourinary:  Genitourinary:  External:  Normal external female genitalia.  Normal urethral meatus, normal Bartholin's and Skene's glands.    Vagina: Normal vaginal mucosa, no evidence of prolapse.    Cervix: Grossly normal in appearance, no bleeding  Uterus: Non-enlarged, mobile, normal contour.  No CMT  Adnexa: ovaries non-enlarged, no adnexal masses  Rectal: deferred  Lymphatic: no evidence of inguinal lymphadenopathy Extremities: no edema, erythema, or tenderness Neurologic: Grossly intact Psychiatric: mood appropriate, affect full   ENDOMETRIAL BIOPSY     The indications for endometrial biopsy were reviewed.   Risks of the biopsy including cramping, bleeding, infection, uterine perforation, inadequate specimen and need for additional procedures  were discussed. The patient states she understands and agrees to undergo procedure today. Consent was signed. Time out was performed. Urine HCG was negative. A Graves speculum was placed and the cervix was brought into view.  The cervix was prepped with Betadine. A single-toothed tenaculum was  placed on the anterior lip of the cervix for traction. A 3 mm pipelle was introduced through the cervix into the endometrial cavity without difficulty to a depth of 8cm, and a small amount of tissue was obtained, the resulting specime sent to pathology. The instruments were removed from the patient's vagina. Minimal bleeding from the cervix was noted. The patient tolerated the procedure well. Routine post-procedure instructions were given to the patient.  She will be contacted by phone one results become available.     Imaging US BREAST LTD UNI LEFT INC AXILLA  Result Date: 06/04/2020 CLINICAL DATA:  The patient was called back for possible left breast distortion, best seen on the cc view. EXAM: DIGITAL DIAGNOSTIC UNILATERAL LEFT MAMMOGRAM WITH TOMOSYNTHESIS AND CAD; ULTRASOUND LEFT BREAST LIMITED TECHNIQUE: Left digital diagnostic mammography and breast tomosynthesis was performed. The images were  evaluated with computer-aided detection.; Targeted ultrasound examination of the left breast was performed COMPARISON:  Previous exam(s). ACR Breast Density Category c: The breast tissue is heterogeneously dense, which may obscure small masses. FINDINGS: The possible left breast distortion is less prominent but does persist on the repeat full paddle laterally exaggerated cc view and on the spot cc view. On physical exam, no suspicious lumps are identified. Targeted ultrasound is performed, showing no sonographic correlate for the possible left breast distortion. IMPRESSION: Possible left breast distortion in the lateral left breast best seen on cc imaging. RECOMMENDATION: Recommend stereotactic biopsy of the left breast distortion. If the distortion cannot be biopsied due to its subtle nature, recommend breast MRI. I have discussed the findings and recommendations with the patient. If applicable, a reminder letter will be sent to the patient regarding the next appointment. BI-RADS CATEGORY  4: Suspicious. Electronically Signed   By: Dorise Bullion III M.D   On:  06/04/2020 15:23   MM DIAG BREAST TOMO UNI LEFT  Result Date: 06/04/2020 CLINICAL DATA:  The patient was called back for possible left breast distortion, best seen on the cc view. EXAM: DIGITAL DIAGNOSTIC UNILATERAL LEFT MAMMOGRAM WITH TOMOSYNTHESIS AND CAD; ULTRASOUND LEFT BREAST LIMITED TECHNIQUE: Left digital diagnostic mammography and breast tomosynthesis was performed. The images were evaluated with computer-aided detection.; Targeted ultrasound examination of the left breast was performed COMPARISON:  Previous exam(s). ACR Breast Density Category c: The breast tissue is heterogeneously dense, which may obscure small masses. FINDINGS: The possible left breast distortion is less prominent but does persist on the repeat full paddle laterally exaggerated cc view and on the spot cc view. On physical exam, no suspicious lumps are identified. Targeted  ultrasound is performed, showing no sonographic correlate for the possible left breast distortion. IMPRESSION: Possible left breast distortion in the lateral left breast best seen on cc imaging. RECOMMENDATION: Recommend stereotactic biopsy of the left breast distortion. If the distortion cannot be biopsied due to its subtle nature, recommend breast MRI. I have discussed the findings and recommendations with the patient. If applicable, a reminder letter will be sent to the patient regarding the next appointment. BI-RADS CATEGORY  4: Suspicious. Electronically Signed   By: Dorise Bullion III M.D   On: 06/04/2020 15:23   MM 3D SCREEN BREAST BILATERAL  Result Date: 06/01/2020 CLINICAL DATA:  Screening. EXAM: DIGITAL SCREENING BILATERAL MAMMOGRAM WITH TOMOSYNTHESIS AND CAD TECHNIQUE: Bilateral screening digital craniocaudal and mediolateral oblique mammograms were obtained. Bilateral screening digital breast tomosynthesis was performed. The images were evaluated with computer-aided detection. COMPARISON:  Previous exam(s). ACR Breast Density Category c: The breast tissue is heterogeneously dense, which may obscure small masses. FINDINGS: In the left breast, possible distortion warrants further evaluation. In the right breast, no findings suspicious for malignancy. IMPRESSION: Further evaluation is suggested for possible distortion in the left breast. RECOMMENDATION: Diagnostic mammogram and possibly ultrasound of the left breast. (Code:[id number]) The patient will be contacted regarding the findings, and additional imaging will be scheduled. BI-RADS CATEGORY  0: Incomplete. Need additional imaging evaluation and/or prior mammograms for comparison. Electronically Signed   By: Lajean Manes M.D.   On: 06/01/2020 10:05   MM CLIP PLACEMENT LEFT  Result Date: 06/10/2020 CLINICAL DATA:  50 year old female status post stereotactic biopsy of subtle, possible left breast distortion. EXAM: DIAGNOSTIC LEFT MAMMOGRAM POST  STEREOTACTIC BIOPSY COMPARISON:  Previous exam(s). FINDINGS: Mammographic images were obtained following stereotactic guided biopsy of the left breast. The biopsy marking clip is in expected position at the site of biopsy. IMPRESSION: Appropriate positioning of the X shaped biopsy marking clip at the site of biopsy in the lateral left breast. Final Assessment: Post Procedure Mammograms for Marker Placement Electronically Signed   By: Kristopher Oppenheim M.D.   On: 06/10/2020 09:19   MM LT BREAST BX W LOC DEV 1ST LESION IMAGE BX SPEC STEREO GUIDE  Addendum Date: 06/16/2020   ADDENDUM REPORT: 06/11/2020 13:50 ADDENDUM: PATHOLOGY revealed: A. BREAST, LEFT UPPER OUTER QUADRANT; STEREOTACTIC CORE NEEDLE BIOPSY: - BENIGN MAMMARY PARENCHYMA WITH COLUMNAR CELL CHANGE WITHOUT ATYPIA, STROMAL FIBROSIS, AND FOCAL USUAL DUCTAL HYPERPLASIA. - NEGATIVE FOR ATYPICAL PROLIFERATIVE BREAST DISEASE. Pathology results are CONCORDANT with imaging findings, per Dr. Kristopher Oppenheim. Pathology results and recommendations were discussed with patient via telephone on 06/11/2020. Patient reported doing well after the biopsy with no adverse symptoms, and only slight tenderness at the site. Post biopsy care instructions were reviewed, questions were answered and my direct  phone number was provided. Patient was encouraged to call [facility] for any additional questions or concerns related to biopsy site. Recommendation: Patient instructed to return in six months for unilateral LEFT breast diagnostic mammogram and possible ultrasound to ensure stability of biopsied site. Patient informed a reminder notice will be sent regarding this appointment and she will need to call mammography site to schedule this appointment. Pathology results reported by Electa Sniff RN on 06/11/2020. Electronically Signed   By: Lajean Manes M.D.   On: 06/11/2020 13:50   Result Date: 06/16/2020 CLINICAL DATA:  50 year old female with possible, subtle left breast  distortion. EXAM: LEFT BREAST STEREOTACTIC CORE NEEDLE BIOPSY COMPARISON:  Previous exams. FINDINGS: The patient and I discussed the procedure of stereotactic-guided biopsy including benefits and alternatives. We discussed the high likelihood of a successful procedure. We discussed the risks of the procedure including infection, bleeding, tissue injury, clip migration, and inadequate sampling. Informed written consent was given. The usual time out protocol was performed immediately prior to the procedure. Using sterile technique and 1% Lidocaine as local anesthetic, under stereotactic guidance, a 9 gauge vacuum assisted device was used to perform core needle biopsy of possible distortion in the lateral left breast using a superior approach. Lesion quadrant: Upper outer quadrant At the conclusion of the procedure, an X shaped tissue marker clip was deployed into the biopsy cavity. Follow-up 2-view mammogram was performed and dictated separately. IMPRESSION: Stereotactic-guided biopsy of the left breast. No apparent complications. Electronically Signed: By: Kristopher Oppenheim M.D. On: 06/10/2020 09:22    Assessment: 50 y.o. [id number] presenting for scheduled hysteroscopy, D&C, abaltion  Plan: 1) The patient was counseled on the overall effectiveness of endometrial ablation in achieving amenorrhea.  She is aware that some patient may continue to have menstrual cycles although these are generally greatly reduced in flow.  In addition she was quoted a failure rate for endometrial ablation of approximately 25% within the frist 4 years, but these failures may happen at any time during or after the initial 4 year postop period.  She is aware that pregnancy is contra-indicated in the setting of prior endometrial ablation, and that ablation itself does not confer any contraceptive benefit.  She will therefore need to continue to rely on some means of contraception following the procedure.  Although rare and generally confined  to patient who have undergone prior tubal ligatoin, post-ablation tubal sterilizaton syndrome (PATSS) may also occur with no reliable incidence rates as the majority of published literature is limited to case reports.   Prior to being considered a candidate for Novasure ablation she will need to undergo endometrial biopsy to rule out endometrial hyperplasia or malignancy as the cause of her bleeding, have an up to date pap on record, and undergo transvaginal ultrasound to verify the absence of focal endometrial lesion which may need to be addressed prior to proceeding with ablation.  In addition she is aware that the device is limited for use in women with a normal uterine cavity and uterine leiomyomata <3cm in size.  If present leiomyomata may increase the long-term failure rate of the procedure.  In rare instances the presence of a uterine septum or arcuate uterus, which may not be readily apparent on preoperative ultrasound, may necessitate the procedure to be aborted.     2) Routine postoperative instructions were reviewed with the patient and her family in detail today including the expected length of recovery and likely postoperative course.  The patient concurred with the proposed  plan, giving informed written consent for the surgery today.  Patient instructed on the importance of being NPO after midnight prior to her procedure.  If warranted preoperative prophylactic antibiotics and SCDs ordered on call to the OR to meet SCIP guidelines and adhere to recommendation laid forth in [place] Number 104 May 2009  "Antibiotic Prophylaxis for Gynecologic Procedures".     Malachy Mood, MD, [facility], [facility] 06/30/2020, 3:00 PM

## 2020-06-30 NOTE — Progress Notes (Signed)
Obstetrics & Gynecology Surgery H&P    Chief Complaint: Scheduled Surgery   History of Present Illness: Patient is a 50 y.o. [id number] presenting for scheduled hysteroscopy, D&C, ablation for the treatment or further evaluation of AUB.   Prior Treatments prior to proceeding with surgery include: medical management  Preoperative Pap: 03/19/2019 NILM HPV negative Preoperative Endometrial biopsy: obtained today Preoperative Ultrasound: 03/19/2019   Review of Systems:10 point review of systems  Past Medical History:  Patient Active Problem List   Diagnosis Date Noted  . Moderate mixed hyperlipidemia not requiring statin therapy 03/29/2020  . Witnessed episode of apnea 07/18/2019  . Family history of breast cancer 03/03/2019  . Essential hypertension   . Lower extremity edema     Past Surgical History:  Past Surgical History:  Procedure Laterality Date  . BREAST BIOPSY Left 06/10/2020   distortion, x marker, path pending  . CHOLECYSTECTOMY, LAPAROSCOPIC    . KNEE ARTHROSCOPY Right 1989  . TONSILLECTOMY    . TUBAL LIGATION      Family History:  Family History  Problem Relation Age of Onset  . Hypertension Mother   . Lupus Mother        discoid  . Cervical cancer Mother   . Breast cancer Sister 28  . Thyroid nodules Sister        thyroid tumor in her teens  . Lung cancer Maternal Grandmother        smoker  . Diabetes Maternal Uncle   . Breast cancer Maternal Uncle        twice  . Bladder Cancer Maternal Uncle   . Lung cancer Maternal Uncle   . Colon cancer Neg Hx     Social History:  Social History   Socioeconomic History  . Marital status: Married    Spouse name: Not on file  . Number of children: 3  . Years of education: Not on file  . Highest education level: Not on file  Occupational History  . Occupation: Pharmacist, hospital  Tobacco Use  . Smoking status: Never Smoker  . Smokeless tobacco: Never Used  Vaping Use  . Vaping Use: Never used  Substance and  Sexual Activity  . Alcohol use: Never  . Drug use: Never  . Sexual activity: Yes    Partners: Male    Birth control/protection: Surgical    Comment: Tubal ligation  Other Topics Concern  . Not on file  Social History Narrative  . Not on file   Social Determinants of Health   Financial Resource Strain: Not on file  Food Insecurity: Not on file  Transportation Needs: Not on file  Physical Activity: Not on file  Stress: Not on file  Social Connections: Not on file  Intimate Partner Violence: Not on file    Allergies:  Allergies  Allergen Reactions  . Codeine Hives  . Tramadol Hives  . Wheat Extract     Other reaction(s): GI Intolerance  . Azithromycin Rash  . Penicillins Rash  . Sulfa Antibiotics Rash    Medications: Prior to Admission medications   Medication Sig Start Date End Date Taking? Authorizing Provider  Apple Cid Vn-Grn Tea-Bit Or-Cr (APPLE CIDER VINEGAR PLUS PO) Take 1 each by mouth daily.    [provider]  Ashwagandha 500 MG CAPS Take 500 mg by mouth daily.    [provider]  Brimonidine Tartrate (LUMIFY) 0.025 % SOLN Place 1 drop into both eyes daily as needed (red eyes).    [provider]  cholecalciferol (VITAMIN D3) 25 MCG (1000 UNIT) tablet Take 1,000 Units by mouth daily.    [provider]  ELDERBERRY PO Take 1 each by mouth daily.    [provider]  hydrochlorothiazide (HYDRODIURIL) 25 MG tablet Take 1 tablet (25 mg total) by mouth in the morning. 06/02/20   Birdie Sons, MD  lisinopril (ZESTRIL) 10 MG tablet Take 1 tablet (10 mg total) by mouth daily. 06/02/20   Birdie Sons, MD  Turmeric 500 MG CAPS Take 500 mg by mouth daily.    [provider]    Physical Exam Vitals: Blood pressure 120/72, weight 148 lb (67.1 kg), last menstrual period 06/15/2020. General: NAD HEENT: normocephalic, anicteric Pulmonary: No increased work of breathing Genitourinary:  Genitourinary:  External:  Normal external female genitalia.  Normal urethral meatus, normal Bartholin's and Skene's glands.    Vagina: Normal vaginal mucosa, no evidence of prolapse.    Cervix: Grossly normal in appearance, no bleeding  Uterus: Non-enlarged, mobile, normal contour.  No CMT  Adnexa: ovaries non-enlarged, no adnexal masses  Rectal: deferred  Lymphatic: no evidence of inguinal lymphadenopathy Extremities: no edema, erythema, or tenderness Neurologic: Grossly intact Psychiatric: mood appropriate, affect full   ENDOMETRIAL BIOPSY     The indications for endometrial biopsy were reviewed.   Risks of the biopsy including cramping, bleeding, infection, uterine perforation, inadequate specimen and need for additional procedures  were discussed. The patient states she understands and agrees to undergo procedure today. Consent was signed. Time out was performed. Urine HCG was negative. A Graves speculum was placed and the cervix was brought into view.  The cervix was prepped with Betadine. A single-toothed tenaculum was  placed on the anterior lip of the cervix for traction. A 3 mm pipelle was introduced through the cervix into the endometrial cavity without difficulty to a depth of 8cm, and a small amount of tissue was obtained, the resulting specime sent to pathology. The instruments were removed from the patient's vagina. Minimal bleeding from the cervix was noted. The patient tolerated the procedure well. Routine post-procedure instructions were given to the patient.  She will be contacted by phone one results become available.     Imaging US BREAST LTD UNI LEFT INC AXILLA  Result Date: 06/04/2020 CLINICAL DATA:  The patient was called back for possible left breast distortion, best seen on the cc view. EXAM: DIGITAL DIAGNOSTIC UNILATERAL LEFT MAMMOGRAM WITH TOMOSYNTHESIS AND CAD; ULTRASOUND LEFT BREAST LIMITED TECHNIQUE: Left digital diagnostic mammography and breast tomosynthesis was performed. The images were  evaluated with computer-aided detection.; Targeted ultrasound examination of the left breast was performed COMPARISON:  Previous exam(s). ACR Breast Density Category c: The breast tissue is heterogeneously dense, which may obscure small masses. FINDINGS: The possible left breast distortion is less prominent but does persist on the repeat full paddle laterally exaggerated cc view and on the spot cc view. On physical exam, no suspicious lumps are identified. Targeted ultrasound is performed, showing no sonographic correlate for the possible left breast distortion. IMPRESSION: Possible left breast distortion in the lateral left breast best seen on cc imaging. RECOMMENDATION: Recommend stereotactic biopsy of the left breast distortion. If the distortion cannot be biopsied due to its subtle nature, recommend breast MRI. I have discussed the findings and recommendations with the patient. If applicable, a reminder letter will be sent to the patient regarding the next appointment. BI-RADS CATEGORY  4: Suspicious. Electronically Signed   By: Dorise Bullion III M.D   On:  06/04/2020 15:23   MM DIAG BREAST TOMO UNI LEFT  Result Date: 06/04/2020 CLINICAL DATA:  The patient was called back for possible left breast distortion, best seen on the cc view. EXAM: DIGITAL DIAGNOSTIC UNILATERAL LEFT MAMMOGRAM WITH TOMOSYNTHESIS AND CAD; ULTRASOUND LEFT BREAST LIMITED TECHNIQUE: Left digital diagnostic mammography and breast tomosynthesis was performed. The images were evaluated with computer-aided detection.; Targeted ultrasound examination of the left breast was performed COMPARISON:  Previous exam(s). ACR Breast Density Category c: The breast tissue is heterogeneously dense, which may obscure small masses. FINDINGS: The possible left breast distortion is less prominent but does persist on the repeat full paddle laterally exaggerated cc view and on the spot cc view. On physical exam, no suspicious lumps are identified. Targeted  ultrasound is performed, showing no sonographic correlate for the possible left breast distortion. IMPRESSION: Possible left breast distortion in the lateral left breast best seen on cc imaging. RECOMMENDATION: Recommend stereotactic biopsy of the left breast distortion. If the distortion cannot be biopsied due to its subtle nature, recommend breast MRI. I have discussed the findings and recommendations with the patient. If applicable, a reminder letter will be sent to the patient regarding the next appointment. BI-RADS CATEGORY  4: Suspicious. Electronically Signed   By: Dorise Bullion III M.D   On: 06/04/2020 15:23   MM 3D SCREEN BREAST BILATERAL  Result Date: 06/01/2020 CLINICAL DATA:  Screening. EXAM: DIGITAL SCREENING BILATERAL MAMMOGRAM WITH TOMOSYNTHESIS AND CAD TECHNIQUE: Bilateral screening digital craniocaudal and mediolateral oblique mammograms were obtained. Bilateral screening digital breast tomosynthesis was performed. The images were evaluated with computer-aided detection. COMPARISON:  Previous exam(s). ACR Breast Density Category c: The breast tissue is heterogeneously dense, which may obscure small masses. FINDINGS: In the left breast, possible distortion warrants further evaluation. In the right breast, no findings suspicious for malignancy. IMPRESSION: Further evaluation is suggested for possible distortion in the left breast. RECOMMENDATION: Diagnostic mammogram and possibly ultrasound of the left breast. (Code:[id number]) The patient will be contacted regarding the findings, and additional imaging will be scheduled. BI-RADS CATEGORY  0: Incomplete. Need additional imaging evaluation and/or prior mammograms for comparison. Electronically Signed   By: Lajean Manes M.D.   On: 06/01/2020 10:05   MM CLIP PLACEMENT LEFT  Result Date: 06/10/2020 CLINICAL DATA:  50 year old female status post stereotactic biopsy of subtle, possible left breast distortion. EXAM: DIAGNOSTIC LEFT MAMMOGRAM POST  STEREOTACTIC BIOPSY COMPARISON:  Previous exam(s). FINDINGS: Mammographic images were obtained following stereotactic guided biopsy of the left breast. The biopsy marking clip is in expected position at the site of biopsy. IMPRESSION: Appropriate positioning of the X shaped biopsy marking clip at the site of biopsy in the lateral left breast. Final Assessment: Post Procedure Mammograms for Marker Placement Electronically Signed   By: Kristopher Oppenheim M.D.   On: 06/10/2020 09:19   MM LT BREAST BX W LOC DEV 1ST LESION IMAGE BX SPEC STEREO GUIDE  Addendum Date: 06/16/2020   ADDENDUM REPORT: 06/11/2020 13:50 ADDENDUM: PATHOLOGY revealed: A. BREAST, LEFT UPPER OUTER QUADRANT; STEREOTACTIC CORE NEEDLE BIOPSY: - BENIGN MAMMARY PARENCHYMA WITH COLUMNAR CELL CHANGE WITHOUT ATYPIA, STROMAL FIBROSIS, AND FOCAL USUAL DUCTAL HYPERPLASIA. - NEGATIVE FOR ATYPICAL PROLIFERATIVE BREAST DISEASE. Pathology results are CONCORDANT with imaging findings, per Dr. Kristopher Oppenheim. Pathology results and recommendations were discussed with patient via telephone on 06/11/2020. Patient reported doing well after the biopsy with no adverse symptoms, and only slight tenderness at the site. Post biopsy care instructions were reviewed, questions were answered and my direct  phone number was provided. Patient was encouraged to call [facility] for any additional questions or concerns related to biopsy site. Recommendation: Patient instructed to return in six months for unilateral LEFT breast diagnostic mammogram and possible ultrasound to ensure stability of biopsied site. Patient informed a reminder notice will be sent regarding this appointment and she will need to call mammography site to schedule this appointment. Pathology results reported by Electa Sniff RN on 06/11/2020. Electronically Signed   By: Lajean Manes M.D.   On: 06/11/2020 13:50   Result Date: 06/16/2020 CLINICAL DATA:  50 year old female with possible, subtle left breast  distortion. EXAM: LEFT BREAST STEREOTACTIC CORE NEEDLE BIOPSY COMPARISON:  Previous exams. FINDINGS: The patient and I discussed the procedure of stereotactic-guided biopsy including benefits and alternatives. We discussed the high likelihood of a successful procedure. We discussed the risks of the procedure including infection, bleeding, tissue injury, clip migration, and inadequate sampling. Informed written consent was given. The usual time out protocol was performed immediately prior to the procedure. Using sterile technique and 1% Lidocaine as local anesthetic, under stereotactic guidance, a 9 gauge vacuum assisted device was used to perform core needle biopsy of possible distortion in the lateral left breast using a superior approach. Lesion quadrant: Upper outer quadrant At the conclusion of the procedure, an X shaped tissue marker clip was deployed into the biopsy cavity. Follow-up 2-view mammogram was performed and dictated separately. IMPRESSION: Stereotactic-guided biopsy of the left breast. No apparent complications. Electronically Signed: By: Kristopher Oppenheim M.D. On: 06/10/2020 09:22    Assessment: 50 y.o. [id number] presenting for scheduled hysteroscopy, D&C, abaltion  Plan: 1) The patient was counseled on the overall effectiveness of endometrial ablation in achieving amenorrhea.  She is aware that some patient may continue to have menstrual cycles although these are generally greatly reduced in flow.  In addition she was quoted a failure rate for endometrial ablation of approximately 25% within the frist 4 years, but these failures may happen at any time during or after the initial 4 year postop period.  She is aware that pregnancy is contra-indicated in the setting of prior endometrial ablation, and that ablation itself does not confer any contraceptive benefit.  She will therefore need to continue to rely on some means of contraception following the procedure.  Although rare and generally confined  to patient who have undergone prior tubal ligatoin, post-ablation tubal sterilizaton syndrome (PATSS) may also occur with no reliable incidence rates as the majority of published literature is limited to case reports.   Prior to being considered a candidate for Novasure ablation she will need to undergo endometrial biopsy to rule out endometrial hyperplasia or malignancy as the cause of her bleeding, have an up to date pap on record, and undergo transvaginal ultrasound to verify the absence of focal endometrial lesion which may need to be addressed prior to proceeding with ablation.  In addition she is aware that the device is limited for use in women with a normal uterine cavity and uterine leiomyomata <3cm in size.  If present leiomyomata may increase the long-term failure rate of the procedure.  In rare instances the presence of a uterine septum or arcuate uterus, which may not be readily apparent on preoperative ultrasound, may necessitate the procedure to be aborted.     2) Routine postoperative instructions were reviewed with the patient and her family in detail today including the expected length of recovery and likely postoperative course.  The patient concurred with the proposed  plan, giving informed written consent for the surgery today.  Patient instructed on the importance of being NPO after midnight prior to her procedure.  If warranted preoperative prophylactic antibiotics and SCDs ordered on call to the OR to meet SCIP guidelines and adhere to recommendation laid forth in [place] Number 104 May 2009  "Antibiotic Prophylaxis for Gynecologic Procedures".     Malachy Mood, MD, [facility], [facility] 06/30/2020, 3:00 PM

## 2020-07-01 ENCOUNTER — Other Ambulatory Visit: Payer: Self-pay

## 2020-07-01 ENCOUNTER — Other Ambulatory Visit
Admission: RE | Admit: 2020-07-01 | Discharge: 2020-07-01 | Disposition: A | Discharge status: Home / Self Care | Payer: BC Managed Care – PPO | Source: Ambulatory Visit | Attending: Obstetrics and Gynecology | Admitting: Obstetrics and Gynecology

## 2020-07-01 HISTORY — DX: Personal history of urinary calculi: Z87.442

## 2020-07-01 NOTE — Patient Instructions (Addendum)
Your procedure is scheduled on: 07/08/20  Report to the Registration Desk on the 1st floor of the [facility]. To find out your arrival time, please call [phone] between 1PM - 3PM on: 07/07/20  Report to [facility] on 07/06/20 at 10:00 am for lab and EKG.  REMEMBER: Instructions that are not followed completely may result in serious medical risk, up to and including death; or upon the discretion of your surgeon and anesthesiologist your surgery may need to be rescheduled.  Do not eat food or drink fluids after midnight the night before surgery.  No gum chewing, lozengers or hard candies.  TAKE THESE MEDICATIONS THE MORNING OF SURGERY WITH A SIP OF WATER: None  One week prior to surgery: Stop Anti-inflammatories (NSAIDS) such as Advil, Aleve, Ibuprofen, Motrin, Naproxen, Naprosyn and Aspirin based products such as Excedrin, Goodys Powder, BC Powder.  Stop ANY OVER THE COUNTER supplements until after surgery.  You may take Tylenol if needed for pain up until the day of surgery.  No Alcohol for 24 hours before or after surgery.  No Smoking including e-cigarettes for 24 hours prior to surgery.  No chewable tobacco products for at least 6 hours prior to surgery.  No nicotine patches on the day of surgery.  Do not use any "recreational" drugs for at least a week prior to your surgery.  Please be advised that the combination of cocaine and anesthesia may have negative outcomes, up to and including death. If you test positive for cocaine, your surgery will be cancelled.  On the morning of surgery brush your teeth with toothpaste and water, you may rinse your mouth with mouthwash if you wish. Do not swallow any toothpaste or mouthwash.  Do not wear jewelry, make-up, hairpins, clips or nail polish.  Do not wear lotions, powders, or perfumes.   Do not shave body from the neck down 48 hours prior to surgery just in case you cut yourself which could leave a site for infection.   Also, freshly shaved skin may become irritated if using the CHG soap.  Contact lenses, hearing aids and dentures may not be worn into surgery.  Do not bring valuables to the hospital. [facility] is not responsible for any missing/lost belongings or valuables.   Notify your doctor if there is any change in your medical condition (cold, fever, infection).  Wear comfortable clothing (specific to your surgery type) to the hospital.  Plan for stool softeners for home use; pain medications have a tendency to cause constipation. You can also help prevent constipation by eating foods high in fiber such as fruits and vegetables and drinking plenty of fluids as your diet allows.  After surgery, you can help prevent lung complications by doing breathing exercises.  Take deep breaths and cough every 1-2 hours. Your doctor may order a device called an Incentive Spirometer to help you take deep breaths. When coughing or sneezing, hold a pillow firmly against your incision with both hands. This is called "splinting." Doing this helps protect your incision. It also decreases belly discomfort.  If you are being admitted to the hospital overnight, leave your suitcase in the car. After surgery it may be brought to your room.  If you are being discharged the day of surgery, you will not be allowed to drive home. You will need a responsible adult (18 years or older) to drive you home and stay with you that night.   If you are taking public transportation, you will need to  have a responsible adult (18 years or older) with you. Please confirm with your physician that it is acceptable to use public transportation.   Please call the [facility] at [phone] if you have any questions about these instructions.  Surgery Visitation Policy:  Patients undergoing a surgery or procedure may have one family member or support person with them as long as that person is not COVID-19 positive or  experiencing its symptoms.  That person may remain in the waiting area during the procedure.  Inpatient Visitation:    Visiting hours are 7 a.m. to 8 p.m. Inpatients will be allowed two visitors daily. The visitors may change each day during the patient's stay. No visitors under the age of 89. Any visitor under the age of 87 must be accompanied by an adult. The visitor must pass COVID-19 screenings, use hand sanitizer when entering and exiting the patient's room and wear a mask at all times, including in the patient's room. Patients must also wear a mask when staff or their visitor are in the room. Masking is required regardless of vaccination status.

## 2020-07-02 LAB — SURGICAL PATHOLOGY

## 2020-07-06 ENCOUNTER — Other Ambulatory Visit
Admission: RE | Admit: 2020-07-06 | Discharge: 2020-07-06 | Disposition: A | Discharge status: Home / Self Care | Payer: BC Managed Care – PPO | Source: Ambulatory Visit | Attending: Obstetrics and Gynecology | Admitting: Obstetrics and Gynecology

## 2020-07-06 ENCOUNTER — Other Ambulatory Visit: Payer: Self-pay

## 2020-07-06 DIAGNOSIS — Z91018 Allergy to other foods: Secondary | ICD-10-CM | POA: Diagnosis not present

## 2020-07-06 DIAGNOSIS — N939 Abnormal uterine and vaginal bleeding, unspecified: Secondary | ICD-10-CM | POA: Diagnosis present

## 2020-07-06 DIAGNOSIS — I10 Essential (primary) hypertension: Secondary | ICD-10-CM | POA: Insufficient documentation

## 2020-07-06 DIAGNOSIS — Z833 Family history of diabetes mellitus: Secondary | ICD-10-CM | POA: Diagnosis not present

## 2020-07-06 DIAGNOSIS — Z8249 Family history of ischemic heart disease and other diseases of the circulatory system: Secondary | ICD-10-CM | POA: Diagnosis not present

## 2020-07-06 DIAGNOSIS — Z8052 Family history of malignant neoplasm of bladder: Secondary | ICD-10-CM | POA: Diagnosis not present

## 2020-07-06 DIAGNOSIS — Z8489 Family history of other specified conditions: Secondary | ICD-10-CM | POA: Diagnosis not present

## 2020-07-06 DIAGNOSIS — Z803 Family history of malignant neoplasm of breast: Secondary | ICD-10-CM | POA: Diagnosis not present

## 2020-07-06 DIAGNOSIS — Z8049 Family history of malignant neoplasm of other genital organs: Secondary | ICD-10-CM | POA: Diagnosis not present

## 2020-07-06 DIAGNOSIS — N840 Polyp of corpus uteri: Secondary | ICD-10-CM | POA: Diagnosis not present

## 2020-07-06 DIAGNOSIS — Z882 Allergy status to sulfonamides status: Secondary | ICD-10-CM | POA: Diagnosis not present

## 2020-07-06 DIAGNOSIS — Z01818 Encounter for other preprocedural examination: Secondary | ICD-10-CM | POA: Insufficient documentation

## 2020-07-06 DIAGNOSIS — Z881 Allergy status to other antibiotic agents status: Secondary | ICD-10-CM | POA: Diagnosis not present

## 2020-07-06 DIAGNOSIS — Z885 Allergy status to narcotic agent status: Secondary | ICD-10-CM | POA: Diagnosis not present

## 2020-07-06 DIAGNOSIS — Z880 Allergy status to penicillin: Secondary | ICD-10-CM | POA: Diagnosis not present

## 2020-07-06 DIAGNOSIS — Z888 Allergy status to other drugs, medicaments and biological substances status: Secondary | ICD-10-CM | POA: Diagnosis not present

## 2020-07-06 LAB — CBC
HCT: 41.9 % (ref 36.0–46.0)
Hemoglobin: 14.3 g/dL (ref 12.0–15.0)
MCH: 31.1 pg (ref 26.0–34.0)
MCHC: 34.1 g/dL (ref 30.0–36.0)
MCV: 91.1 fL (ref 80.0–100.0)
Platelets: 277 10*3/uL (ref 150–400)
RBC: 4.6 MIL/uL (ref 3.87–5.11)
RDW: 12.4 % (ref 11.5–15.5)
WBC: 6 10*3/uL (ref 4.0–10.5)
nRBC: 0 % (ref 0.0–0.2)

## 2020-07-08 ENCOUNTER — Other Ambulatory Visit: Payer: Self-pay

## 2020-07-08 ENCOUNTER — Encounter
Admission: RE | Disposition: A | Discharge status: Home / Self Care | Payer: Self-pay | Source: Home / Self Care | Attending: Obstetrics and Gynecology

## 2020-07-08 ENCOUNTER — Ambulatory Visit: Payer: BC Managed Care – PPO | Admitting: Anesthesiology

## 2020-07-08 ENCOUNTER — Ambulatory Visit
Admission: RE | Admit: 2020-07-08 | Discharge: 2020-07-08 | Disposition: A | Discharge status: Home / Self Care | Payer: BC Managed Care – PPO | Attending: Obstetrics and Gynecology | Admitting: Obstetrics and Gynecology

## 2020-07-08 ENCOUNTER — Encounter: Payer: Self-pay | Admitting: Obstetrics and Gynecology

## 2020-07-08 DIAGNOSIS — Z882 Allergy status to sulfonamides status: Secondary | ICD-10-CM | POA: Insufficient documentation

## 2020-07-08 DIAGNOSIS — Z91018 Allergy to other foods: Secondary | ICD-10-CM | POA: Insufficient documentation

## 2020-07-08 DIAGNOSIS — N939 Abnormal uterine and vaginal bleeding, unspecified: Secondary | ICD-10-CM | POA: Diagnosis not present

## 2020-07-08 DIAGNOSIS — N840 Polyp of corpus uteri: Secondary | ICD-10-CM | POA: Diagnosis not present

## 2020-07-08 DIAGNOSIS — Z8049 Family history of malignant neoplasm of other genital organs: Secondary | ICD-10-CM | POA: Insufficient documentation

## 2020-07-08 DIAGNOSIS — Z803 Family history of malignant neoplasm of breast: Secondary | ICD-10-CM | POA: Insufficient documentation

## 2020-07-08 DIAGNOSIS — Z8489 Family history of other specified conditions: Secondary | ICD-10-CM | POA: Insufficient documentation

## 2020-07-08 DIAGNOSIS — Z881 Allergy status to other antibiotic agents status: Secondary | ICD-10-CM | POA: Insufficient documentation

## 2020-07-08 DIAGNOSIS — I10 Essential (primary) hypertension: Secondary | ICD-10-CM | POA: Insufficient documentation

## 2020-07-08 DIAGNOSIS — Z8052 Family history of malignant neoplasm of bladder: Secondary | ICD-10-CM | POA: Insufficient documentation

## 2020-07-08 DIAGNOSIS — Z880 Allergy status to penicillin: Secondary | ICD-10-CM | POA: Insufficient documentation

## 2020-07-08 DIAGNOSIS — Z8249 Family history of ischemic heart disease and other diseases of the circulatory system: Secondary | ICD-10-CM | POA: Insufficient documentation

## 2020-07-08 DIAGNOSIS — Z833 Family history of diabetes mellitus: Secondary | ICD-10-CM | POA: Insufficient documentation

## 2020-07-08 DIAGNOSIS — Z885 Allergy status to narcotic agent status: Secondary | ICD-10-CM | POA: Insufficient documentation

## 2020-07-08 DIAGNOSIS — Z888 Allergy status to other drugs, medicaments and biological substances status: Secondary | ICD-10-CM | POA: Insufficient documentation

## 2020-07-08 HISTORY — PX: DILATATION & CURETTAGE/HYSTEROSCOPY WITH MYOSURE: SHX6511

## 2020-07-08 LAB — POCT PREGNANCY, URINE: Preg Test, Ur: NEGATIVE

## 2020-07-08 SURGERY — DILATATION & CURETTAGE/HYSTEROSCOPY WITH MYOSURE
Anesthesia: General

## 2020-07-08 MED ORDER — PROPOFOL 10 MG/ML IV BOLUS
INTRAVENOUS | Status: DC | PRN
  Administered 2020-07-08: 200 mg via INTRAVENOUS

## 2020-07-08 MED ORDER — ACETAMINOPHEN 160 MG/5ML PO SOLN
325.0000 mg | ORAL | Status: DC | PRN
  Filled 2020-07-08: qty 20.3

## 2020-07-08 MED ORDER — DEXAMETHASONE SODIUM PHOSPHATE 10 MG/ML IJ SOLN
INTRAMUSCULAR | Status: DC | PRN
  Administered 2020-07-08: 10 mg via INTRAVENOUS

## 2020-07-08 MED ORDER — FAMOTIDINE 20 MG PO TABS
ORAL_TABLET | ORAL | Status: AC
  Administered 2020-07-08: 20 mg via ORAL
  Filled 2020-07-08: qty 1

## 2020-07-08 MED ORDER — CHLORHEXIDINE GLUCONATE 0.12 % MT SOLN: 15.0000 mL | Freq: Once | OROMUCOSAL | Status: AC

## 2020-07-08 MED ORDER — CHLORHEXIDINE GLUCONATE 0.12 % MT SOLN
OROMUCOSAL | Status: AC
  Administered 2020-07-08: 15 mL via OROMUCOSAL
  Filled 2020-07-08: qty 15

## 2020-07-08 MED ORDER — FAMOTIDINE 20 MG PO TABS: 20.0000 mg | ORAL_TABLET | Freq: Once | ORAL | Status: AC

## 2020-07-08 MED ORDER — HYDROCODONE-ACETAMINOPHEN 5-325 MG PO TABS: 1.0000 | ORAL_TABLET | Freq: Four times a day (QID) | ORAL | 0 refills | Status: DC | PRN

## 2020-07-08 MED ORDER — SILVER NITRATE-POT NITRATE 75-25 % EX MISC
CUTANEOUS | Status: DC | PRN
  Administered 2020-07-08: 2 via TOPICAL

## 2020-07-08 MED ORDER — FENTANYL CITRATE (PF) 100 MCG/2ML IJ SOLN
INTRAMUSCULAR | Status: AC
  Filled 2020-07-08: qty 2

## 2020-07-08 MED ORDER — FENTANYL CITRATE (PF) 100 MCG/2ML IJ SOLN: 25.0000 ug | INTRAMUSCULAR | Status: DC | PRN

## 2020-07-08 MED ORDER — ACETAMINOPHEN 325 MG PO TABS: 325.0000 mg | ORAL_TABLET | ORAL | Status: DC | PRN

## 2020-07-08 MED ORDER — PROMETHAZINE HCL 25 MG/ML IJ SOLN: 6.2500 mg | INTRAMUSCULAR | Status: DC | PRN

## 2020-07-08 MED ORDER — SILVER NITRATE-POT NITRATE 75-25 % EX MISC
CUTANEOUS | Status: AC
  Filled 2020-07-08: qty 10

## 2020-07-08 MED ORDER — ONDANSETRON HCL 4 MG/2ML IJ SOLN
INTRAMUSCULAR | Status: DC | PRN
  Administered 2020-07-08: 4 mg via INTRAVENOUS

## 2020-07-08 MED ORDER — IBUPROFEN 600 MG PO TABS
600.0000 mg | ORAL_TABLET | Freq: Once | ORAL | Status: AC
  Administered 2020-07-08: 600 mg via ORAL

## 2020-07-08 MED ORDER — POVIDONE-IODINE 10 % EX SWAB: 2.0000 "application " | Freq: Once | CUTANEOUS | Status: DC

## 2020-07-08 MED ORDER — MIDAZOLAM HCL 2 MG/2ML IJ SOLN
INTRAMUSCULAR | Status: AC
  Filled 2020-07-08: qty 2

## 2020-07-08 MED ORDER — LACTATED RINGERS IV SOLN: INTRAVENOUS | Status: DC | PRN

## 2020-07-08 MED ORDER — LACTATED RINGERS IR SOLN
Status: DC | PRN
  Administered 2020-07-08: 4000 mL

## 2020-07-08 MED ORDER — LIDOCAINE HCL (CARDIAC) PF 100 MG/5ML IV SOSY
PREFILLED_SYRINGE | INTRAVENOUS | Status: DC | PRN
  Administered 2020-07-08: 100 mg via INTRAVENOUS

## 2020-07-08 MED ORDER — ORAL CARE MOUTH RINSE: 15.0000 mL | Freq: Once | OROMUCOSAL | Status: AC

## 2020-07-08 MED ORDER — KETOROLAC TROMETHAMINE 30 MG/ML IJ SOLN
INTRAMUSCULAR | Status: DC | PRN
  Administered 2020-07-08: 30 mg via INTRAVENOUS

## 2020-07-08 MED ORDER — PROPOFOL 10 MG/ML IV BOLUS
INTRAVENOUS | Status: AC
  Filled 2020-07-08: qty 20

## 2020-07-08 MED ORDER — ACETAMINOPHEN 10 MG/ML IV SOLN
INTRAVENOUS | Status: AC
  Filled 2020-07-08: qty 100

## 2020-07-08 MED ORDER — IBUPROFEN 600 MG PO TABS
ORAL_TABLET | ORAL | Status: AC
  Filled 2020-07-08: qty 1

## 2020-07-08 MED ORDER — IBUPROFEN 600 MG PO TABS: 600.0000 mg | ORAL_TABLET | Freq: Four times a day (QID) | ORAL | 0 refills | Status: DC | PRN

## 2020-07-08 MED ORDER — FENTANYL CITRATE (PF) 100 MCG/2ML IJ SOLN
INTRAMUSCULAR | Status: DC | PRN
  Administered 2020-07-08: 25 ug via INTRAVENOUS
  Administered 2020-07-08: 50 ug via INTRAVENOUS
  Administered 2020-07-08: 25 ug via INTRAVENOUS

## 2020-07-08 MED ORDER — EPHEDRINE SULFATE 50 MG/ML IJ SOLN
INTRAMUSCULAR | Status: DC | PRN
  Administered 2020-07-08: 10 mg via INTRAVENOUS

## 2020-07-08 MED ORDER — LACTATED RINGERS IV SOLN: INTRAVENOUS | Status: DC

## 2020-07-08 MED ORDER — MIDAZOLAM HCL 2 MG/2ML IJ SOLN
INTRAMUSCULAR | Status: DC | PRN
  Administered 2020-07-08: 2 mg via INTRAVENOUS

## 2020-07-08 SURGICAL SUPPLY — 23 items
BACTOSHIELD CHG 4% 4OZ (MISCELLANEOUS) ×2
CANISTER SUC SOCK COL 7IN (MISCELLANEOUS) ×4 IMPLANT
CATH ROBINSON RED A/P 16FR (CATHETERS) ×4 IMPLANT
COVER WAND RF STERILE (DRAPES) ×4 IMPLANT
DEVICE MYOSURE LITE (MISCELLANEOUS) ×4 IMPLANT
DEVICE MYOSURE REACH (MISCELLANEOUS) IMPLANT
ELECT REM PT RETURN 9FT ADLT (ELECTROSURGICAL) ×4
ELECTRODE REM PT RTRN 9FT ADLT (ELECTROSURGICAL) ×2 IMPLANT
GLOVE SURG ENC MOIS LTX SZ7 (GLOVE) ×4 IMPLANT
GOWN STRL REUS W/ TWL LRG LVL3 (GOWN DISPOSABLE) ×4 IMPLANT
GOWN STRL REUS W/TWL LRG LVL3 (GOWN DISPOSABLE) ×8
IV NS IRRIG 3000ML ARTHROMATIC (IV SOLUTION) ×4 IMPLANT
KIT PROCEDURE FLUENT (KITS) IMPLANT
KIT TURNOVER CYSTO (KITS) ×4 IMPLANT
MANIFOLD NEPTUNE II (INSTRUMENTS) ×4 IMPLANT
PACK DNC HYST (MISCELLANEOUS) ×4 IMPLANT
PAD OB MATERNITY 4.3X12.25 (PERSONAL CARE ITEMS) ×4 IMPLANT
PAD PREP 24X41 OB/GYN DISP (PERSONAL CARE ITEMS) ×4 IMPLANT
SCRUB CHG 4% DYNA-HEX 4OZ (MISCELLANEOUS) ×2 IMPLANT
SEAL ROD LENS SCOPE MYOSURE (ABLATOR) ×4 IMPLANT
TOWEL OR 17X26 4PK STRL BLUE (TOWEL DISPOSABLE) ×4 IMPLANT
TUBING CONNECTING 10 (TUBING) ×3 IMPLANT
TUBING CONNECTING 10' (TUBING) ×1

## 2020-07-08 NOTE — Anesthesia Preprocedure Evaluation (Addendum)
Anesthesia Evaluation  Patient identified by MRN, date of birth, ID band Patient awake    Reviewed: Allergy & Precautions, H&P , NPO status , reviewed documented beta blocker date and time   Airway Mallampati: II  TM Distance: >3 FB Neck ROM: full    Dental  (+) Teeth Intact   Pulmonary    Pulmonary exam normal        Cardiovascular hypertension, Normal cardiovascular exam     Neuro/Psych    GI/Hepatic GERD  Medicated and Controlled,  Endo/Other    Renal/GU Renal disease     Musculoskeletal   Abdominal   Peds  Hematology   Anesthesia Other Findings Past Medical History: 03/2019: BRCA negative     Comment:  IBIS=12.8%/riskscore=18.9% No date: Breast microcalcifications No date: Essential hypertension No date: Family history of breast cancer No date: History of kidney stones No date: Kidney stones No date: Lower extremity edema No date: Ovarian cyst Past Surgical History: 06/10/2020: BREAST BIOPSY; Left     Comment:  distortion, x marker, path pending No date: CHOLECYSTECTOMY No date: CHOLECYSTECTOMY, LAPAROSCOPIC 1989: KNEE ARTHROSCOPY; Right No date: TONSILLECTOMY No date: TUBAL LIGATION   Reproductive/Obstetrics                            Anesthesia Physical Anesthesia Plan  ASA: 2  Anesthesia Plan: General   Post-op Pain Management:    Induction: Intravenous  PONV Risk Score and Plan: 3 and Ondansetron, Midazolam and Treatment may vary due to age or medical condition  Airway Management Planned: LMA  Additional Equipment:   Intra-op Plan:   Post-operative Plan: Extubation in OR  Informed Consent: I have reviewed the patients History and Physical, chart, labs and discussed the procedure including the risks, benefits and alternatives for the proposed anesthesia with the patient or authorized representative who has indicated his/her understanding and acceptance.      Dental Advisory Given  Plan Discussed with: CRNA  Anesthesia Plan Comments:         Anesthesia Quick Evaluation

## 2020-07-08 NOTE — Op Note (Signed)
Preoperative Diagnosis: 1) 50 y.o. with abnormal uterine bleeidng  Postoperative Diagnosis: 1) 50 y.o. with abnormal uterine bleeding 2) Endometrial polyp  Operation Performed: Operative hysteroscopy and polypectomy  Indication: 50 y.o. with abnormal uterine bleeding who had initially opted for surgical management via novasure ablation  Anesthesia: General  Primary Surgeon: Malachy Mood, MD  Assistant: none  Preoperative Antibiotics: none  Estimated Blood Loss: 10 mL  IV Fluids: 580mL  Urine Output:: ~96mL straight cath  Drains or Tubes: none  Implants: none  Specimens Removed: endometrial curettings and polyp  Complications: none  Intraoperative Findings:  Normal appearing cervix.  Extremely retroflexed uterus with some narrowing at the internal cervical os.  The uterine cavity has multiple polyp throughout including right fundal, anterior posterior, and mid left lateral wall of the uterus.  Patient Condition: stable  Procedure in Detail:  Patient was taken to the operating room were she was administered general endotracheal anesthesia.  She was positioned in the dorsal lithotomy position utilizing Allen stirups, prepped and draped in the usual sterile fashion.  Uterus was noted to be non-enlarged in size, retroverted.   Prior to proceeding with the case a time out was performed.  Attention was turned to the patient's pelvis.  A red rubber catheter was used to empty the patient's bladder.  An operative speculum was placed to allow visualization of the cervix.  The anterior lip of the cervix was grasped with a single tooth tenaculum and the cervix was sequentially dilated using pratt dilators.  Given the degree of retroflexion there was difficulty dilating the internal cervical os.  Polyp forceps were advanced into the cervix and opened to aid in dilation of the internal cervical os.  The hysteroscope was then advanced into the uterine cavity noting the above findings.   Targeted curettage was performed using a Myosure device and the resulting specimen collected and sent to pathology.  All polyps were able to be removed successfully restoring a normal uterine cavity.  The single tooth tenaculum was removed from the cervix.  The tenaculum sites and cervix were noted to be  Hemostatic before removing the operative speculum.  Sponge needle and instrument counts were corrects times two.  The patient tolerated the procedure well and was taken to the recovery room in stable condition.

## 2020-07-08 NOTE — Interval H&P Note (Signed)
History and Physical Interval Note:  07/08/2020 1:13 PM  Anita Cowan  has presented today for surgery, with the diagnosis of abnormal uterine bleeding.  The various methods of treatment have been discussed with the patient and family. After consideration of risks, benefits and other options for treatment, the patient has consented to  Procedure(s): [place] (N/A) as a surgical intervention.  The patient's history has been reviewed, patient examined, no change in status, stable for surgery.  I have reviewed the patient's chart and labs.  Questions were answered to the patient's satisfaction.     Malachy Mood

## 2020-07-08 NOTE — Anesthesia Postprocedure Evaluation (Signed)
Anesthesia Post Note  Patient: Anita Cowan  Procedure(s) Performed: [place] POLYPECTOMY  Patient location during evaluation: PACU Anesthesia Type: General and Combined Spinal/Epidural Level of consciousness: awake and alert, awake and oriented Pain management: pain level controlled Vital Signs Assessment: post-procedure vital signs reviewed and stable Respiratory status: spontaneous breathing, nonlabored ventilation and respiratory function stable Cardiovascular status: blood pressure returned to baseline and stable Postop Assessment: no apparent nausea or vomiting Anesthetic complications: no   No notable events documented.   Last Vitals:  Vitals:   07/08/20 1530 07/08/20 1552  BP: 127/81 133/74  Pulse: 68 72  Resp: 12 16  Temp: 36.4 C (!) 36.2 C  SpO2: 99% 100%    Last Pain:  Vitals:   07/08/20 1552  TempSrc: Temporal  PainSc: 1                  Phill Mutter

## 2020-07-08 NOTE — Transfer of Care (Addendum)
Immediate Anesthesia Transfer of Care Note  Patient: Anita Cowan  Procedure(s) Performed: DILATATION & CURETTAGE/HYSTEROSCOPY WITH [place]  Patient Location: PACU  Anesthesia Type:General  Level of Consciousness: awake and sedated  Airway & Oxygen Therapy: Patient Spontanous Breathing and Patient connected to face mask oxygen  Post-op Assessment: Report given to RN and Post -op Vital signs reviewed and stable  Post vital signs: Reviewed and stable  Last Vitals:  Vitals Value Taken Time  BP 124/75 07/08/20 1447  Temp    Pulse 61 07/08/20 1450  Resp 17 07/08/20 1450  SpO2 100 % 07/08/20 1450  Vitals shown include unvalidated device data.  Last Pain:  Vitals:   07/08/20 1300  PainSc: 0-No pain         Complications: No notable events documented.

## 2020-07-08 NOTE — Discharge Instructions (Signed)

## 2020-07-09 ENCOUNTER — Encounter: Payer: Self-pay | Admitting: Obstetrics and Gynecology

## 2020-07-12 LAB — SURGICAL PATHOLOGY

## 2020-07-23 ENCOUNTER — Ambulatory Visit (INDEPENDENT_AMBULATORY_CARE_PROVIDER_SITE_OTHER): Payer: BC Managed Care – PPO | Admitting: Obstetrics and Gynecology

## 2020-07-23 ENCOUNTER — Other Ambulatory Visit: Payer: Self-pay

## 2020-07-23 ENCOUNTER — Encounter: Payer: Self-pay | Admitting: Obstetrics and Gynecology

## 2020-07-23 VITALS — BP 126/72 | Ht 66.0 in | Wt 152.0 lb

## 2020-07-23 DIAGNOSIS — Z4889 Encounter for other specified surgical aftercare: Secondary | ICD-10-CM

## 2020-07-23 NOTE — Progress Notes (Signed)
      Postoperative Follow-up Patient presents post op from hysteroscopy, D&C (polypectomy) 1weeks ago for abnormal uterine bleeding.  Subjective: Patient reports marked improvement in her preop symptoms. Eating a regular diet without difficulty. The patient is not having any pain.  Activity: normal activities of daily living.  Objective: Blood pressure 126/72, height 5\' 6"  (1.676 m), weight 152 lb (68.9 kg).  Admission on 07/08/2020, Discharged on 07/08/2020  Component Date Value Ref Range Status   Preg Test, Ur 07/08/2020 NEGATIVE  NEGATIVE Final   Comment:        THE SENSITIVITY OF THIS METHODOLOGY IS >24 mIU/mL    SURGICAL PATHOLOGY 07/08/2020    Final-Edited                   Value:SURGICAL PATHOLOGY CASE: [id number] PATIENT: [facility] Surgical Pathology Report     Specimen Submitted: A. Endometrial curettings and polyps  Clinical History: Abnormal uterine bleeding     DIAGNOSIS: A. ENDOMETRIUM; CURETTAGE AND POLYPECTOMY: - ENDOMETRIAL POLYP FRAGMENTS IN A BACKGROUND OF SECRETORY ENDOMETRIUM. - NEGATIVE FOR ATYPIA AND MALIGNANCY.  GROSS DESCRIPTION: A. Labeled: Endometrial curettings and polyps Received: In formalin Collection time: 2:30 PM on 07/08/2020 Placed into formalin time: 2:33 PM on 07/08/2020 Tissue fragment(s): Multiple Size: 2.5 x 2.5 x 0.3 cm Description: Multiple fragments of pink-tan soft tissue Entirely submitted in 1 cassette.  BD 07/09/2020  Final Diagnosis performed by Bryan Lemma, MD.   Electronically signed 07/12/2020 6:52:58PM The electronic signature indicates that the named Attending Pathologist has evaluated the specimen Technical component performed at [facility], [address], [place], [place]                         [place] Lab: [phone] Dir: Rush Farmer, MD, MMM  Professional component performed at [facility], [facility], [place], [place], [place] [address] Lab: [phone] Dir: Dellia Nims. Reuel Derby, MD     Assessment: 50 y.o. s/p hysteroscopy, D&C (polypectomy) stable  Plan: Patient has done well after surgery with no apparent complications.  I have discussed the post-operative course to date, and the expected progress moving forward.  The patient understands what complications to be concerned about.  I will see the patient in routine follow up, or sooner if needed.    Activity plan: No restriction.  We discussed pathology revealed bening endometrial polyps, no concerning changes.  She has had negative myrisk testing no family history of lynch syndrome or colon cancer.  We discussed management options include expectant, Mirena IUD, ablation, hysterectomy.  NO that focal lesions have been addressed and pathology revealed benign findings patient would like to proceed with ablation as initially planned.  Post for Novasure given benign patholgoy   Malachy Mood, MD, [place], [facility] 07/23/2020, 3:24 PM

## 2020-07-27 ENCOUNTER — Telehealth: Payer: Self-pay

## 2020-07-27 NOTE — Telephone Encounter (Signed)
Called patient to schedule Hysteroscopy D&C with ablation w Georgianne Fick  DOS 7/12  H&P  n/a  Pre-admit phone call appointment to be requested - date and time will be included on H&P paper work. Also all appointments will be updated on pt MyChart. Explained that this appointment has a call window. Based on the time scheduled will indicate if the call will be received within a 4 hour window before 1:00 or after.  Advised that pt may also receive calls from the hospital pharmacy and pre-service center.  Confirmed pt has [employer] as Chartered certified accountant. No secondary insurance.

## 2020-07-27 NOTE — Telephone Encounter (Signed)
-----   Message from Malachy Mood, MD sent at 07/24/2020 11:42 PM EDT ----- Regarding: Surgery Surgery Booking Request Patient Full Name:  Anita Cowan  MRN: [id number]  DOB: [birth year]  Surgeon: Malachy Mood, MD  Requested Surgery Date and Time: FIRST AVAILABLE  Primary Diagnosis AND Code: AUB Secondary Diagnosis and Code:  Surgical Procedure: Hysteroscopy D&C with Ablation RNFA Requested?: No L&D Notification: No Admission Status: same day surgery Length of Surgery: 50 min Special Case Needs: No H&P: No Phone Interview???:  No Interpreter: No Medical Clearance:  No Special Scheduling Instructions: THIS IS A REPOSTING WE WERE UNABLE TO DO ABLATION SECONDARY TO FINDING OF ENDOMETRIAL POLYPS AT THE TIME OF D&C AND PLANNED ABLATION Any known health/anesthesia issues, diabetes, sleep apnea, latex allergy, defibrillator/pacemaker?: No Acuity: P3   (P1 highest, P2 delay may cause harm, P3 low, elective gyn, P4 lowest)

## 2020-07-30 ENCOUNTER — Encounter
Admission: RE | Admit: 2020-07-30 | Discharge: 2020-07-30 | Disposition: A | Discharge status: Home / Self Care | Payer: BC Managed Care – PPO | Source: Ambulatory Visit | Attending: Obstetrics and Gynecology | Admitting: Obstetrics and Gynecology

## 2020-07-30 ENCOUNTER — Other Ambulatory Visit: Payer: Self-pay

## 2020-07-30 NOTE — Patient Instructions (Signed)
Your procedure is scheduled on: 08/03/20 Report to [place]. To find out your arrival time please call [phone] between 1PM - 3PM on 08/02/20.  Remember: Instructions that are not followed completely may result in serious medical risk, up to and including death, or upon the discretion of your surgeon and anesthesiologist your surgery may need to be rescheduled.     _X__ 1. Do not eat food or drink liquids after midnight the night before your procedure.                 No gum chewing or hard candies.   __X__2.  On the morning of surgery brush your teeth with toothpaste and water, you                 may rinse your mouth with mouthwash if you wish.  Do not swallow any              toothpaste of mouthwash.     _X__ 3.  No Alcohol for 24 hours before or after surgery.   _X__ 4.  Do Not Smoke or use e-cigarettes For 24 Hours Prior to Your Surgery.                 Do not use any chewable tobacco products for at least 6 hours prior to                 surgery.  ____  5.  Bring all medications with you on the day of surgery if instructed.   __X__  6.  Notify your doctor if there is any change in your medical condition      (cold, fever, infections).     Do not wear jewelry, make-up, hairpins, clips or nail polish. Do not wear lotions, powders, or perfumes.  Do not shave 48 hours prior to surgery. Men may shave face and neck. Do not bring valuables to the hospital.    [facility] is not responsible for any belongings or valuables.  Contacts, dentures/partials or body piercings may not be worn into surgery. Bring a case for your contacts, glasses or hearing aids, a denture cup will be supplied. Leave your suitcase in the car. After surgery it may be brought to your room. For patients admitted to the hospital, discharge time is determined by your treatment team.   Patients discharged the day of surgery will not be allowed to drive  home.   Please read over the following fact sheets that you were given:     __X__ Take these medicines the morning of surgery with A SIP OF WATER:    1. none  2.   3.   4.  5.  6.  ____ Fleet Enema (as directed)   ____ Use CHG Soap/SAGE wipes as directed  ____ Use inhalers on the day of surgery  ____ Stop metformin/Janumet/Farxiga 2 days prior to surgery    ____ Take 1/2 of usual insulin dose the night before surgery. No insulin the morning          of surgery.   ____ Stop Blood Thinners Coumadin/Plavix/Xarelto/Pleta/Pradaxa/Eliquis/Effient/Aspirin  on   Or contact your Surgeon, Cardiologist or Medical Doctor regarding  ability to stop your blood thinners  __X__ Stop Anti-inflammatories 7 days before surgery such as Advil, Ibuprofen, Motrin,  BC or Goodies Powder, Naprosyn, Naproxen, Aleve, Aspirin    __X__ Stop all herbal supplements, fish oil or vitamin E until after  surgery.    ____ Bring C-Pap to the hospital.

## 2020-08-02 ENCOUNTER — Other Ambulatory Visit: Payer: Self-pay | Admitting: Obstetrics and Gynecology

## 2020-08-03 ENCOUNTER — Other Ambulatory Visit: Payer: Self-pay

## 2020-08-03 ENCOUNTER — Encounter
Admission: RE | Disposition: A | Discharge status: Home / Self Care | Payer: Self-pay | Source: Home / Self Care | Attending: Obstetrics and Gynecology

## 2020-08-03 ENCOUNTER — Ambulatory Visit: Payer: BC Managed Care – PPO | Admitting: Anesthesiology

## 2020-08-03 ENCOUNTER — Ambulatory Visit
Admission: RE | Admit: 2020-08-03 | Discharge: 2020-08-03 | Disposition: A | Discharge status: Home / Self Care | Payer: BC Managed Care – PPO | Attending: Obstetrics and Gynecology | Admitting: Obstetrics and Gynecology

## 2020-08-03 ENCOUNTER — Encounter: Payer: Self-pay | Admitting: Obstetrics and Gynecology

## 2020-08-03 DIAGNOSIS — Z882 Allergy status to sulfonamides status: Secondary | ICD-10-CM | POA: Diagnosis not present

## 2020-08-03 DIAGNOSIS — Z888 Allergy status to other drugs, medicaments and biological substances status: Secondary | ICD-10-CM | POA: Insufficient documentation

## 2020-08-03 DIAGNOSIS — Z79899 Other long term (current) drug therapy: Secondary | ICD-10-CM | POA: Insufficient documentation

## 2020-08-03 DIAGNOSIS — N939 Abnormal uterine and vaginal bleeding, unspecified: Secondary | ICD-10-CM | POA: Insufficient documentation

## 2020-08-03 DIAGNOSIS — Z885 Allergy status to narcotic agent status: Secondary | ICD-10-CM | POA: Insufficient documentation

## 2020-08-03 DIAGNOSIS — Z880 Allergy status to penicillin: Secondary | ICD-10-CM | POA: Insufficient documentation

## 2020-08-03 DIAGNOSIS — Z881 Allergy status to other antibiotic agents status: Secondary | ICD-10-CM | POA: Insufficient documentation

## 2020-08-03 HISTORY — PX: DILITATION & CURRETTAGE/HYSTROSCOPY WITH NOVASURE ABLATION: SHX5568

## 2020-08-03 LAB — POCT PREGNANCY, URINE: Preg Test, Ur: NEGATIVE

## 2020-08-03 SURGERY — DILATATION & CURETTAGE/HYSTEROSCOPY WITH NOVASURE ABLATION
Anesthesia: General

## 2020-08-03 MED ORDER — 0.9 % SODIUM CHLORIDE (POUR BTL) OPTIME
TOPICAL | Status: DC | PRN
  Administered 2020-08-03: 1000 mL

## 2020-08-03 MED ORDER — SILVER NITRATE-POT NITRATE 75-25 % EX MISC
CUTANEOUS | Status: AC
  Filled 2020-08-03: qty 10

## 2020-08-03 MED ORDER — HYDROCODONE-ACETAMINOPHEN 5-325 MG PO TABS: 1.0000 | ORAL_TABLET | ORAL | Status: DC | PRN

## 2020-08-03 MED ORDER — PROPOFOL 10 MG/ML IV BOLUS
INTRAVENOUS | Status: DC | PRN
  Administered 2020-08-03: 150 mg via INTRAVENOUS

## 2020-08-03 MED ORDER — MIDAZOLAM HCL 2 MG/2ML IJ SOLN
INTRAMUSCULAR | Status: DC | PRN
  Administered 2020-08-03: 2 mg via INTRAVENOUS

## 2020-08-03 MED ORDER — CHLORHEXIDINE GLUCONATE 0.12 % MT SOLN
OROMUCOSAL | Status: AC
  Administered 2020-08-03: 15 mL via OROMUCOSAL
  Filled 2020-08-03: qty 15

## 2020-08-03 MED ORDER — GLYCOPYRROLATE 0.2 MG/ML IJ SOLN
INTRAMUSCULAR | Status: AC
  Filled 2020-08-03: qty 1

## 2020-08-03 MED ORDER — HYDROCODONE-ACETAMINOPHEN 5-325 MG PO TABS
ORAL_TABLET | ORAL | Status: AC
  Administered 2020-08-03: 1 via ORAL
  Filled 2020-08-03: qty 1

## 2020-08-03 MED ORDER — FENTANYL CITRATE (PF) 100 MCG/2ML IJ SOLN
INTRAMUSCULAR | Status: AC
  Filled 2020-08-03: qty 2

## 2020-08-03 MED ORDER — SODIUM CHLORIDE 0.9 % IR SOLN
Status: DC | PRN
  Administered 2020-08-03: 500 mL

## 2020-08-03 MED ORDER — GLYCOPYRROLATE 0.2 MG/ML IJ SOLN
INTRAMUSCULAR | Status: DC | PRN
  Administered 2020-08-03: .2 mg via INTRAVENOUS

## 2020-08-03 MED ORDER — IBUPROFEN 600 MG PO TABS: 600.0000 mg | ORAL_TABLET | Freq: Four times a day (QID) | ORAL | 0 refills | Status: DC | PRN

## 2020-08-03 MED ORDER — MIDAZOLAM HCL 2 MG/2ML IJ SOLN
INTRAMUSCULAR | Status: AC
  Filled 2020-08-03: qty 2

## 2020-08-03 MED ORDER — FAMOTIDINE 20 MG PO TABS
ORAL_TABLET | ORAL | Status: AC
  Administered 2020-08-03: 20 mg via ORAL
  Filled 2020-08-03: qty 1

## 2020-08-03 MED ORDER — FENTANYL CITRATE (PF) 100 MCG/2ML IJ SOLN
INTRAMUSCULAR | Status: DC | PRN
  Administered 2020-08-03 (×2): 50 ug via INTRAVENOUS

## 2020-08-03 MED ORDER — LACTATED RINGERS IV SOLN: INTRAVENOUS | Status: DC

## 2020-08-03 MED ORDER — PROPOFOL 10 MG/ML IV BOLUS
INTRAVENOUS | Status: AC
  Filled 2020-08-03: qty 20

## 2020-08-03 MED ORDER — ONDANSETRON HCL 4 MG/2ML IJ SOLN
INTRAMUSCULAR | Status: AC
  Filled 2020-08-03: qty 2

## 2020-08-03 MED ORDER — DEXAMETHASONE SODIUM PHOSPHATE 10 MG/ML IJ SOLN
INTRAMUSCULAR | Status: AC
  Filled 2020-08-03: qty 1

## 2020-08-03 MED ORDER — DEXAMETHASONE SODIUM PHOSPHATE 10 MG/ML IJ SOLN
INTRAMUSCULAR | Status: DC | PRN
  Administered 2020-08-03: 8 mg via INTRAVENOUS

## 2020-08-03 MED ORDER — FAMOTIDINE 20 MG PO TABS: 20.0000 mg | ORAL_TABLET | Freq: Once | ORAL | Status: AC

## 2020-08-03 MED ORDER — FENTANYL CITRATE (PF) 100 MCG/2ML IJ SOLN
25.0000 ug | INTRAMUSCULAR | Status: DC | PRN
  Administered 2020-08-03 (×2): 25 ug via INTRAVENOUS
  Administered 2020-08-03: 50 ug via INTRAVENOUS

## 2020-08-03 MED ORDER — KETOROLAC TROMETHAMINE 30 MG/ML IJ SOLN
INTRAMUSCULAR | Status: DC | PRN
  Administered 2020-08-03: 30 mg via INTRAVENOUS

## 2020-08-03 MED ORDER — SILVER NITRATE-POT NITRATE 75-25 % EX MISC
CUTANEOUS | Status: DC | PRN
  Administered 2020-08-03: 2

## 2020-08-03 MED ORDER — MEPERIDINE HCL 25 MG/ML IJ SOLN: 6.2500 mg | INTRAMUSCULAR | Status: DC | PRN

## 2020-08-03 MED ORDER — LIDOCAINE HCL (CARDIAC) PF 100 MG/5ML IV SOSY
PREFILLED_SYRINGE | INTRAVENOUS | Status: DC | PRN
  Administered 2020-08-03: 50 mg via INTRAVENOUS

## 2020-08-03 MED ORDER — ONDANSETRON HCL 4 MG/2ML IJ SOLN: 4.0000 mg | Freq: Once | INTRAMUSCULAR | Status: DC | PRN

## 2020-08-03 MED ORDER — ORAL CARE MOUTH RINSE: 15.0000 mL | Freq: Once | OROMUCOSAL | Status: AC

## 2020-08-03 MED ORDER — HYDROCODONE-ACETAMINOPHEN 5-325 MG PO TABS: 1.0000 | ORAL_TABLET | Freq: Four times a day (QID) | ORAL | 0 refills | Status: DC | PRN

## 2020-08-03 MED ORDER — ONDANSETRON HCL 4 MG/2ML IJ SOLN
INTRAMUSCULAR | Status: DC | PRN
  Administered 2020-08-03: 4 mg via INTRAVENOUS

## 2020-08-03 MED ORDER — CHLORHEXIDINE GLUCONATE 0.12 % MT SOLN: 15.0000 mL | Freq: Once | OROMUCOSAL | Status: AC

## 2020-08-03 MED ORDER — PHENYLEPHRINE HCL (PRESSORS) 10 MG/ML IV SOLN
INTRAVENOUS | Status: AC
  Filled 2020-08-03: qty 1

## 2020-08-03 MED ORDER — PHENYLEPHRINE HCL (PRESSORS) 10 MG/ML IV SOLN
INTRAVENOUS | Status: DC | PRN
  Administered 2020-08-03 (×2): 100 ug via INTRAVENOUS

## 2020-08-03 MED ORDER — LIDOCAINE HCL (PF) 2 % IJ SOLN
INTRAMUSCULAR | Status: AC
  Filled 2020-08-03: qty 5

## 2020-08-03 SURGICAL SUPPLY — 23 items
ABLATOR SURESOUND NOVASURE (ABLATOR) ×2 IMPLANT
BACTOSHIELD CHG 4% 4OZ (MISCELLANEOUS) ×1
CANISTER SUC SOCK COL 7IN (MISCELLANEOUS) IMPLANT
CATH ROBINSON RED A/P 16FR (CATHETERS) ×2 IMPLANT
DEVICE MYOSURE LITE (MISCELLANEOUS) IMPLANT
DEVICE MYOSURE REACH (MISCELLANEOUS) IMPLANT
ELECT REM PT RETURN 9FT ADLT (ELECTROSURGICAL) ×2
ELECTRODE REM PT RTRN 9FT ADLT (ELECTROSURGICAL) ×1 IMPLANT
GAUZE 4X4 16PLY ~~LOC~~+RFID DBL (SPONGE) ×2 IMPLANT
GLOVE SURG ENC MOIS LTX SZ7 (GLOVE) ×2 IMPLANT
GOWN STRL REUS W/ TWL LRG LVL3 (GOWN DISPOSABLE) ×2 IMPLANT
GOWN STRL REUS W/TWL LRG LVL3 (GOWN DISPOSABLE) ×4
IV NS IRRIG 3000ML ARTHROMATIC (IV SOLUTION) ×2 IMPLANT
KIT PROCEDURE FLUENT (KITS) IMPLANT
KIT TURNOVER CYSTO (KITS) ×2 IMPLANT
MANIFOLD NEPTUNE II (INSTRUMENTS) ×2 IMPLANT
PACK DNC HYST (MISCELLANEOUS) ×2 IMPLANT
PAD OB MATERNITY 4.3X12.25 (PERSONAL CARE ITEMS) ×2 IMPLANT
PAD PREP 24X41 OB/GYN DISP (PERSONAL CARE ITEMS) ×2 IMPLANT
SCRUB CHG 4% DYNA-HEX 4OZ (MISCELLANEOUS) ×1 IMPLANT
SEAL ROD LENS SCOPE MYOSURE (ABLATOR) ×2 IMPLANT
TOWEL OR 17X26 4PK STRL BLUE (TOWEL DISPOSABLE) ×2 IMPLANT
TUBING CONNECTING 10 (TUBING) ×2 IMPLANT

## 2020-08-03 NOTE — H&P (Signed)
Obstetrics & Gynecology Surgery H&P    Chief Complaint: Scheduled Surgery   History of Present Illness: Patient is a 50 y.o. [id number] presenting for scheduled hysterosocpy, D&C, polypectomy, for the treatment or further evaluation of AUB.   Prior Treatments prior to proceeding with surgery include: hysteroscopy, D&C  Preoperative Pap: 03/19/2019 NILM Endometrial biopsy: 06/30/20 benign with polypectomy 07/08/20 also benign endometrial polyp Preoperative Ultrasound: 03/19/2019   Review of Systems:10 point review of systems  Past Medical History:  Patient Active Problem List   Diagnosis Date Noted   Endometrial polyp    Abnormal uterine bleeding (AUB)    Moderate mixed hyperlipidemia not requiring statin therapy 03/29/2020   Witnessed episode of apnea 07/18/2019   Family history of breast cancer 03/03/2019   Essential hypertension    Lower extremity edema     Past Surgical History:  Past Surgical History:  Procedure Laterality Date   BREAST BIOPSY Left 06/10/2020   distortion, x marker, path pending   CHOLECYSTECTOMY     CHOLECYSTECTOMY, LAPAROSCOPIC     DILATATION & CURETTAGE/HYSTEROSCOPY WITH MYOSURE  07/08/2020   Procedure: [place] POLYPECTOMY;  Surgeon: Malachy Mood, MD;  Location: [facility] ORS;  Service: Gynecology;;   KNEE ARTHROSCOPY Right 1989   TONSILLECTOMY     TUBAL LIGATION      Family History:  Family History  Problem Relation Age of Onset   Hypertension Mother    Lupus Mother        discoid   Cervical cancer Mother    Breast cancer Sister 81   Thyroid nodules Sister        thyroid tumor in her teens   Lung cancer Maternal Grandmother        smoker   Diabetes Maternal Uncle    Breast cancer Maternal Uncle        twice   Bladder Cancer Maternal Uncle    Lung cancer Maternal Uncle    Colon cancer Neg Hx     Social History:  Social History   Socioeconomic History   Marital status: Married    Spouse  name: Not on file   Number of children: 3   Years of education: Not on file   Highest education level: Not on file  Occupational History   Occupation: Pharmacist, hospital  Tobacco Use   Smoking status: Never   Smokeless tobacco: Never  Vaping Use   Vaping Use: Never used  Substance and Sexual Activity   Alcohol use: Yes    Comment: rarely   Drug use: Never   Sexual activity: Yes    Partners: Male    Birth control/protection: Surgical    Comment: Tubal ligation  Other Topics Concern   Not on file  Social History Narrative   Not on file   Social Determinants of Health   Financial Resource Strain: Not on file  Food Insecurity: Not on file  Transportation Needs: Not on file  Physical Activity: Not on file  Stress: Not on file  Social Connections: Not on file  Intimate Partner Violence: Not on file    Allergies:  Allergies  Allergen Reactions   Codeine Hives   Tramadol Hives   Wheat Extract     Other reaction(s): GI Intolerance   Azithromycin Rash   Penicillins Rash    Reaction: 10 years ago   Sulfa Antibiotics Rash    Medications: Prior to Admission medications   Medication Sig Start Date End Date Taking? Authorizing Provider  Brimonidine Tartrate (LUMIFY)  0.025 % SOLN Place 1 drop into both eyes daily as needed (red eyes).   Yes [provider]  hydrochlorothiazide (HYDRODIURIL) 25 MG tablet Take 1 tablet (25 mg total) by mouth in the morning. 06/02/20  Yes Fisher, Kirstie Peri, MD  lisinopril (ZESTRIL) 10 MG tablet Take 1 tablet (10 mg total) by mouth daily. 06/02/20  Yes Birdie Sons, MD  Apple Cid Vn-Grn Tea-Bit Or-Cr (APPLE CIDER VINEGAR PLUS PO) Take 1 each by mouth daily.    [provider]  Ashwagandha 500 MG CAPS Take 500 mg by mouth daily.    [provider]  cholecalciferol (VITAMIN D3) 25 MCG (1000 UNIT) tablet Take 1,000 Units by mouth daily.    [provider]  ELDERBERRY PO Take 1 each by mouth daily.    [provider]  Turmeric 500 MG CAPS Take 500 mg by mouth daily.    [provider]    Physical Exam Vitals: Blood pressure 113/86, pulse 82, temperature 98.2 F (36.8 C), temperature source Oral, resp. rate 16, height 5\' 6"  (1.676 m), weight 68 kg, SpO2 99 %. General: NAD HEENT: normocephalic, anicteric Pulmonary: No increased work of breathing Cardiovascular: RRR, distal pulses 2+ Abdomen: soft, non-tender, non-distended Extremities: no edema, erythema, or tenderness Neurologic: Grossly intact Psychiatric: mood appropriate, affect full  Imaging No results found.  Assessment: 50 y.o. [id number] presenting for scheduled hysteroscopy, novasure endometrial ablation  Plan: 1) The patient was counseled on the overall effectiveness of endometrial ablation in achieving amenorrhea.  She is aware that some patient may continue to have menstrual cycles although these are generally greatly reduced in flow.  In addition she was quoted a failure rate for endometrial ablation of approximately 25% within the frist 4 years, but these failures may happen at any time during or after the initial 4 year postop period.  She is aware that pregnancy is contra-indicated in the setting of prior endometrial ablation, and that ablation itself does not confer any contraceptive benefit.  She will therefore need to continue to rely on some means of contraception following the procedure.  Although rare and generally confined to patient who have undergone prior tubal ligatoin, post-ablation tubal sterilizaton syndrome (PATSS) may also occur with no reliable incidence rates as the majority of published literature is limited to case reports.   Prior to being considered a candidate for Novasure ablation she will need to undergo endometrial biopsy to rule out endometrial hyperplasia or malignancy as the cause of her bleeding, have an up to date pap on record, and undergo transvaginal ultrasound to verify the absence of focal  endometrial lesion which may need to be addressed prior to proceeding with ablation.  In addition she is aware that the device is limited for use in women with a normal uterine cavity and uterine leiomyomata <3cm in size.  If present leiomyomata may increase the long-term failure rate of the procedure.  In rare instances the presence of a uterine septum or arcuate uterus, which may not be readily apparent on preoperative ultrasound, may necessitate the procedure to be aborted.    2) Routine postoperative instructions were reviewed with the patient and her family in detail today including the expected length of recovery and likely postoperative course.  The patient concurred with the proposed plan, giving informed written consent for the surgery today.  Patient instructed on the importance of being NPO after midnight prior to her procedure.  If warranted preoperative prophylactic antibiotics and SCDs ordered on call  to the OR to meet SCIP guidelines and adhere to recommendation laid forth in [place] Number 104 May 2009  "Antibiotic Prophylaxis for Gynecologic Procedures".     Malachy Mood, MD, [facility], [facility] 08/03/2020, 7:24 AM

## 2020-08-03 NOTE — Op Note (Signed)
Preoperative Diagnosis: 1) 50 y.o.  abnormal uterine bleeding  Postoperative Diagnosis: 1) 50 y.o. abnormal uterine bleeding  Operation Performed: Hysteroscopy, dilation and curettage, novasure endometrial ablation  Indication: 50 year old with abnormal uterine bleeding. The patient was previously counseled on the overall effectiveness of the device in achieving amenorrhea.  She is aware that some patient may continue to have menstrual cycles although these are generally greatly reduced in flow.  In addition she was quoted a failure rate for endometrial ablation of approximately 25% within the frist 4 years, but these failures may happen at any time during or after the initial 4 year postop period.  She is aware that pregnancy is contra-indicated in the setting of prior endometrial ablation, and that ablation itself does not confer and contraceptive benefits and that she will need to continue to rely on some means of contraception following the procedure.  Although rare and generally confined to patient who have undergone prior tubal ligatoin, post-ablation tubal sterilizaton syndrome (PATSS) may also occur.   Surgeon: Malachy Mood, MD  Anesthesia: General  Preoperative Antibiotics: none  Estimated Blood Loss: minimal  IV Fluids: 535mL  Urine Output:: 28mL  Drains or Tubes: none  Implants: none  Specimens Removed: none  Complications: none  Intraoperative Findings: Normal appearing vaginal mucosa and cervix.  Normal uterine cavity.  Good coverage of the uterine cavity on post-tablation hysteroscopy with ablation of the uterine cornua and sparing of the endocervix.  Sounding length 9cm, cervical length 4.5cm, and ablation time 1:33min.   Patient Condition: stable  Procedure in Detail:  After informed consent was obtained, the patient was taken to the operating room where anesthesia was obtained without difficulty. The patient was positioned in the dorsal lithotomy position  using Allen stirrups. The patient's bladder was decompressed using a red rubber catheter. The patient was examined under anesthesia, with the above noted findings.  An operative speculum was placed inside the patient's vagina, the cervix was adequately visualized, and the the anterior lip of the cervix was grasped with a single tooth tenaculum. The uterine cavity was sounded to 9 cm, and then the cervix was progressively dilated to  35mm using Pratt dilators. The 0 degree hysteroscope was introduced through the cervix and advanced under direct visualization in the uterine cavity.  Normal saline was used as the distending fluid during the hysteroscopy.  This revealed the above noted intracavitary findings. The cervical length was obtained by retracting the hysteroscope to the level of the internal cervical os and marking that point on the hysteroscopy.  This measurement yielded a value of 4.5cm, yielding a total cavity length of 4.5cm based on the previously obtained sounding measurement  The NovaSure device was then placed without difficulty. The Novasure array was deployed and seated, with a resulting uterine width of 4.6cm. The NovaSure device passed the cavity assessment test.  Following this the device was activaed at a power of with a total ablation time of 105 seconds. The NovaSure device was removed and repeat hysteroscopy reveals an appropriate lining of the uterus and no perforation or injury. Hysteroscope is removed with minimal discrepancy of fluid.   Tenaculum was removed with excellent hemostasis noted after application of silver nitrate to the tenaculum entry sites. She was then taken out of dorsal lithotomy. Hemostasis noted.  The patient tolerated the procedure well. Sponge, lap and needle counts were correct times two. The patient was taken to recovery room in excellent condition.

## 2020-08-03 NOTE — Anesthesia Postprocedure Evaluation (Signed)
Anesthesia Post Note  Patient: Anita Cowan  Procedure(s) Performed: HYSTEROSCOPY WITH [place] ABLATION  Patient location during evaluation: PACU Anesthesia Type: General Level of consciousness: awake and alert, awake and oriented Pain management: pain level controlled Vital Signs Assessment: post-procedure vital signs reviewed and stable Respiratory status: spontaneous breathing, nonlabored ventilation and respiratory function stable Cardiovascular status: blood pressure returned to baseline and stable Postop Assessment: no apparent nausea or vomiting Anesthetic complications: no   No notable events documented.   Last Vitals:  Vitals:   08/03/20 0921 08/03/20 0935  BP:  125/77  Pulse: (!) 57 65  Resp: (!) 9 16  Temp: (!) 36.2 C 36.8 C  SpO2: 99% 99%    Last Pain:  Vitals:   08/03/20 0935  TempSrc: Temporal  PainSc: 5                  Phill Mutter

## 2020-08-03 NOTE — Anesthesia Procedure Notes (Signed)
Procedure Name: LMA Insertion Date/Time: 08/03/2020 7:44 AM Performed by: Lerry Liner, CRNA Pre-anesthesia Checklist: Patient identified, Emergency Drugs available, Suction available and Patient being monitored Patient Re-evaluated:Patient Re-evaluated prior to induction Oxygen Delivery Method: Circle system utilized Preoxygenation: Pre-oxygenation with 100% oxygen Induction Type: IV induction Ventilation: Mask ventilation without difficulty LMA: LMA inserted LMA Size: 3.5 Number of attempts: 1 Placement Confirmation: positive ETCO2 Tube secured with: Tape Dental Injury: Teeth and Oropharynx as per pre-operative assessment

## 2020-08-03 NOTE — Discharge Instructions (Signed)
AMBULATORY SURGERY  ?DISCHARGE INSTRUCTIONS ? ? ?The drugs that you were given will stay in your system until tomorrow so for the next 24 hours you should not: ? ?Drive an automobile ?Make any legal decisions ?Drink any alcoholic beverage ? ? ?You may resume regular meals tomorrow.  Today it is better to start with liquids and gradually work up to solid foods. ? ?You may eat anything you prefer, but it is better to start with liquids, then soup and crackers, and gradually work up to solid foods. ? ? ?Please notify your doctor immediately if you have any unusual bleeding, trouble breathing, redness and pain at the surgery site, drainage, fever, or pain not relieved by medication. ? ? ? ?Additional Instructions: ? ? ? ?Please contact your physician with any problems or Same Day Surgery at [phone], Monday through Friday 6 am to 4 pm, or [facility] Main number at [phone].  ?

## 2020-08-03 NOTE — OR Nursing (Signed)
0.9% NS 500 MLS USED FOR HYSTEROSCOPY OUT PUT 475 DEFICIT 25 MLS DR STAEBLER NOTIFIED.

## 2020-08-03 NOTE — Transfer of Care (Signed)
Immediate Anesthesia Transfer of Care Note  Patient: Anita Cowan  Procedure(s) Performed: DILATATION & CURETTAGE/HYSTEROSCOPY WITH NOVASURE ABLATION  Patient Location: PACU  Anesthesia Type:General  Level of Consciousness: drowsy  Airway & Oxygen Therapy: Patient Spontanous Breathing  Post-op Assessment: Report given to RN  Post vital signs: stable  Last Vitals:  Vitals Value Taken Time  BP    Temp    Pulse 70 08/03/20 0826  Resp 10 08/03/20 0826  SpO2 100 % 08/03/20 0826  Vitals shown include unvalidated device data.  Last Pain:  Vitals:   08/03/20 0608  TempSrc: Oral  PainSc: 0-No pain         Complications: No notable events documented.

## 2020-08-03 NOTE — Anesthesia Preprocedure Evaluation (Signed)
Anesthesia Evaluation  Patient identified by MRN, date of birth, ID band Patient awake    Reviewed: Allergy & Precautions, H&P , NPO status , reviewed documented beta blocker date and time   Airway Mallampati: II  TM Distance: >3 FB Neck ROM: full    Dental  (+) Teeth Intact   Pulmonary    Pulmonary exam normal        Cardiovascular hypertension, Normal cardiovascular exam     Neuro/Psych    GI/Hepatic GERD  Medicated and Controlled,  Endo/Other    Renal/GU Renal disease     Musculoskeletal   Abdominal   Peds  Hematology   Anesthesia Other Findings Past Medical History: 03/2019: BRCA negative     Comment:  IBIS=12.8%/riskscore=18.9% No date: Breast microcalcifications No date: Essential hypertension No date: Family history of breast cancer No date: History of kidney stones No date: Kidney stones No date: Lower extremity edema No date: Ovarian cyst Past Surgical History: 06/10/2020: BREAST BIOPSY; Left     Comment:  distortion, x marker, path pending No date: CHOLECYSTECTOMY No date: CHOLECYSTECTOMY, LAPAROSCOPIC 1989: KNEE ARTHROSCOPY; Right No date: TONSILLECTOMY No date: TUBAL LIGATION   Reproductive/Obstetrics                             Anesthesia Physical  Anesthesia Plan  ASA: 2  Anesthesia Plan: General   Post-op Pain Management:    Induction: Intravenous  PONV Risk Score and Plan: 3 and Ondansetron, Midazolam and Treatment may vary due to age or medical condition  Airway Management Planned: LMA  Additional Equipment:   Intra-op Plan:   Post-operative Plan: Extubation in OR  Informed Consent: I have reviewed the patients History and Physical, chart, labs and discussed the procedure including the risks, benefits and alternatives for the proposed anesthesia with the patient or authorized representative who has indicated his/her understanding and acceptance.      Dental Advisory Given  Plan Discussed with: CRNA, Anesthesiologist and Surgeon  Anesthesia Plan Comments:        Anesthesia Quick Evaluation  

## 2020-08-05 ENCOUNTER — Other Ambulatory Visit: Payer: Self-pay

## 2020-08-05 ENCOUNTER — Encounter: Payer: Self-pay | Admitting: Family Medicine

## 2020-08-05 ENCOUNTER — Ambulatory Visit: Payer: BC Managed Care – PPO | Admitting: Family Medicine

## 2020-08-05 VITALS — BP 121/76 | HR 68 | Temp 97.9°F | Resp 16 | Wt 152.0 lb

## 2020-08-05 DIAGNOSIS — E782 Mixed hyperlipidemia: Secondary | ICD-10-CM | POA: Diagnosis not present

## 2020-08-05 DIAGNOSIS — N939 Abnormal uterine and vaginal bleeding, unspecified: Secondary | ICD-10-CM | POA: Diagnosis not present

## 2020-08-05 DIAGNOSIS — I10 Essential (primary) hypertension: Secondary | ICD-10-CM | POA: Diagnosis not present

## 2020-08-05 NOTE — Assessment & Plan Note (Signed)
Continue Lisinopril & HCTZ

## 2020-08-05 NOTE — Progress Notes (Signed)
Established patient visit   Patient: Anita Cowan   DOB: 07-25-1970   50 y.o. Female  MRN: [id number] Visit Date: 08/05/2020  Today's healthcare provider: Lavon Paganini, MD   Chief Complaint  Patient presents with   Hypertension   Subjective     Hypertension Well controlled on Lisinopril & HCTZ; patient denies side effects. She denies chest pain and feels satisfied w/ recent BP control.  Hyperlipidemia - Stable, prior lipid panel in 03/2020  H/o abnormal mammogram - Negative breast biopsy 06/10/20  Uterine polyps - s/p uterine ablation 08/03/20 - pt. denies post-op pain/complications - follow up scheduled for 08/12/20    Medications: Outpatient Medications Prior to Visit  Medication Sig   Apple Cid Vn-Grn Tea-Bit Or-Cr (APPLE CIDER VINEGAR PLUS PO) Take 1 each by mouth daily.   Ashwagandha 500 MG CAPS Take 500 mg by mouth daily.   Brimonidine Tartrate (LUMIFY) 0.025 % SOLN Place 1 drop into both eyes daily as needed (red eyes).   cholecalciferol (VITAMIN D3) 25 MCG (1000 UNIT) tablet Take 1,000 Units by mouth daily.   ELDERBERRY PO Take 1 each by mouth daily.   hydrochlorothiazide (HYDRODIURIL) 25 MG tablet Take 1 tablet (25 mg total) by mouth in the morning.   lisinopril (ZESTRIL) 10 MG tablet Take 1 tablet (10 mg total) by mouth daily.   Turmeric 500 MG CAPS Take 500 mg by mouth daily.   HYDROcodone-acetaminophen (NORCO/VICODIN) 5-325 MG tablet Take 1 tablet by mouth every 6 (six) hours as needed for severe pain. (Patient not taking: Reported on 08/05/2020)   ibuprofen (ADVIL) 600 MG tablet Take 1 tablet (600 mg total) by mouth every 6 (six) hours as needed for mild pain or cramping. (Patient not taking: Reported on 08/05/2020)   No facility-administered medications prior to visit.    Review of Systems  Constitutional:  Negative for fatigue.  HENT: Negative.    Eyes: Negative.   Respiratory: Negative.    Cardiovascular: Negative.  Negative for chest pain  and leg swelling.  Gastrointestinal: Negative.   Endocrine: Negative.   Genitourinary: Negative.   Neurological:  Negative for dizziness and light-headedness.  All other systems reviewed and are negative.     Objective    BP 121/76   Pulse 68   Temp 97.9 F (36.6 C) (Oral)   Resp 16   Wt 152 lb (68.9 kg)   SpO2 98%   BMI 24.53 kg/m     Physical Exam Constitutional:      General: She is not in acute distress.    Appearance: Normal appearance. She is normal weight.  HENT:     Head: Normocephalic and atraumatic.     Right Ear: External ear normal.     Left Ear: External ear normal.  Eyes:     Conjunctiva/sclera: Conjunctivae normal.  Cardiovascular:     Rate and Rhythm: Normal rate and regular rhythm.     Pulses: Normal pulses.     Heart sounds: Normal heart sounds.  Pulmonary:     Effort: Pulmonary effort is normal.     Breath sounds: Normal breath sounds.  Neurological:     Mental Status: She is alert.      No results found for any visits on 08/05/20.  Assessment & Plan     Hypertension Continue Lisinopril & HCTZ;  Continue home BP monitoring Recheck BMP at CPE upcoming  2. Hyperlipidemia - Stable - May consider adding statin at future visit  3. Uterine polyps -  follow up scheduled for 08/12/20   Return in about 2 months (around 10/06/2020) for CPE, as scheduled.      Patient seen along with MS3 student Percell Locus. I personally evaluated this patient along with the student, and verified all aspects of the history, physical exam, and medical decision making as documented by the student. I agree with the student's documentation and have made all necessary edits.  , Dionne Bucy, MD, MPH [facility]

## 2020-08-12 ENCOUNTER — Other Ambulatory Visit: Payer: Self-pay | Admitting: Family Medicine

## 2020-08-12 ENCOUNTER — Ambulatory Visit: Payer: BC Managed Care – PPO | Admitting: Obstetrics and Gynecology

## 2020-08-12 DIAGNOSIS — I10 Essential (primary) hypertension: Secondary | ICD-10-CM

## 2020-08-13 ENCOUNTER — Ambulatory Visit (INDEPENDENT_AMBULATORY_CARE_PROVIDER_SITE_OTHER): Payer: BC Managed Care – PPO | Admitting: Obstetrics and Gynecology

## 2020-08-13 ENCOUNTER — Other Ambulatory Visit: Payer: Self-pay

## 2020-08-13 ENCOUNTER — Encounter: Payer: Self-pay | Admitting: Obstetrics and Gynecology

## 2020-08-13 VITALS — BP 110/66 | Ht 66.0 in | Wt 151.0 lb

## 2020-08-13 DIAGNOSIS — Z09 Encounter for follow-up examination after completed treatment for conditions other than malignant neoplasm: Secondary | ICD-10-CM

## 2020-08-16 NOTE — Progress Notes (Signed)
      Postoperative Follow-up Patient presents post op from hysteroscopy, novasure ablation 1weeks ago for abnormal uterine bleeding.  Subjective: Patient reports marked improvement in her preop symptoms. Eating a regular diet without difficulty. Pain is controlled without any medications.  Activity: normal activities of daily living.  Objective: Blood pressure 110/66, height '5\' 6"'$  (1.676 m), weight 151 lb (68.5 kg).  General: NAD Pulmonary: no increased work of breathing Extremities: no edema Neurologic: normal gait   Admission on 08/03/2020, Discharged on 08/03/2020  Component Date Value Ref Range Status   Preg Test, Ur 08/03/2020 NEGATIVE  NEGATIVE Final   Comment:        THE SENSITIVITY OF THIS METHODOLOGY IS >24 mIU/mL     Assessment: 50 y.o. s/p hysteroscopy, novasure ablation stable  Plan: Patient has done well after surgery with no apparent complications.  I have discussed the post-operative course to date, and the expected progress moving forward.  The patient understands what complications to be concerned about.  I will see the patient in routine follow up, or sooner if needed.    Activity plan: No restriction.   Malachy Mood, MD, [facility], [place]

## 2020-09-20 ENCOUNTER — Other Ambulatory Visit: Payer: Self-pay

## 2020-09-20 ENCOUNTER — Ambulatory Visit (INDEPENDENT_AMBULATORY_CARE_PROVIDER_SITE_OTHER): Payer: BC Managed Care – PPO | Admitting: Obstetrics and Gynecology

## 2020-09-20 VITALS — BP 120/72 | Wt 153.0 lb

## 2020-09-20 DIAGNOSIS — Z4889 Encounter for other specified surgical aftercare: Secondary | ICD-10-CM

## 2020-09-20 NOTE — Progress Notes (Signed)
      Postoperative Follow-up Patient presents post op from hysteroscopy, novasure 6weeks ago for abnormal uterine bleeding.  Subjective: Patient reports marked improvement in her preop symptoms. Eating a regular diet without difficulty. The patient is not having any pain.  Activity: normal activities of daily living.  Objective: Blood pressure 120/72, weight 153 lb (69.4 kg).  General: NAD Pulmonary: no increased work of breathing Abdomen: soft, non-tender, non-distended, incision GU: normal external female genitalia normal cervix, no CMT, uterus normal in shape and contour, no adnexal tenderness or masses Extremities: no edema Neurologic: normal gait   Admission on 08/03/2020, Discharged on 08/03/2020  Component Date Value Ref Range Status   Preg Test, Ur 08/03/2020 NEGATIVE  NEGATIVE Final   Comment:        THE SENSITIVITY OF THIS METHODOLOGY IS >24 mIU/mL     Assessment: 50 y.o. s/p hysteroscopy, novasure stable  Plan: Patient has done well after surgery with no apparent complications.  I have discussed the post-operative course to date, and the expected progress moving forward.  The patient understands what complications to be concerned about.  I will see the patient in routine follow up, or sooner if needed.    Activity plan: No restriction.   Malachy Mood, MD, [facility], [facility] 09/20/2020, 4:28 PM

## 2020-10-04 ENCOUNTER — Telehealth: Payer: Self-pay

## 2020-10-04 ENCOUNTER — Encounter: Payer: Self-pay | Admitting: Family Medicine

## 2020-10-04 ENCOUNTER — Other Ambulatory Visit: Payer: Self-pay

## 2020-10-04 ENCOUNTER — Ambulatory Visit (INDEPENDENT_AMBULATORY_CARE_PROVIDER_SITE_OTHER): Payer: BC Managed Care – PPO | Admitting: Family Medicine

## 2020-10-04 ENCOUNTER — Telehealth: Payer: Self-pay | Admitting: Family Medicine

## 2020-10-04 VITALS — BP 121/81 | HR 76 | Temp 98.0°F | Resp 16 | Ht 66.0 in | Wt 152.9 lb

## 2020-10-04 DIAGNOSIS — E782 Mixed hyperlipidemia: Secondary | ICD-10-CM | POA: Diagnosis not present

## 2020-10-04 DIAGNOSIS — I10 Essential (primary) hypertension: Secondary | ICD-10-CM

## 2020-10-04 DIAGNOSIS — Z0000 Encounter for general adult medical examination without abnormal findings: Secondary | ICD-10-CM

## 2020-10-04 DIAGNOSIS — Z1211 Encounter for screening for malignant neoplasm of colon: Secondary | ICD-10-CM

## 2020-10-04 DIAGNOSIS — E538 Deficiency of other specified B group vitamins: Secondary | ICD-10-CM

## 2020-10-04 DIAGNOSIS — E559 Vitamin D deficiency, unspecified: Secondary | ICD-10-CM

## 2020-10-04 DIAGNOSIS — Z23 Encounter for immunization: Secondary | ICD-10-CM | POA: Insufficient documentation

## 2020-10-04 DIAGNOSIS — R928 Other abnormal and inconclusive findings on diagnostic imaging of breast: Secondary | ICD-10-CM

## 2020-10-04 MED ORDER — HYDROCHLOROTHIAZIDE 25 MG PO TABS: 25.0000 mg | ORAL_TABLET | Freq: Every morning | ORAL | 3 refills | Status: DC

## 2020-10-04 MED ORDER — LISINOPRIL 10 MG PO TABS: 10.0000 mg | ORAL_TABLET | Freq: Every day | ORAL | 3 refills | Status: DC

## 2020-10-04 NOTE — Assessment & Plan Note (Signed)
Well controlled Continue current medications Recheck metabolic panel F/u in 6 months  

## 2020-10-04 NOTE — Telephone Encounter (Signed)
Copied from [place] [id number]. Topic: General - Other >> Oct 04, 2020  2:34 PM Parke Poisson wrote: Reason for CRM: Per [facility] the order you entered for mammogram is incorrect. They will need an order for diagnostic uni left mammogram TOMO [id number] and left breat ultrasound limited [id number]

## 2020-10-04 NOTE — Assessment & Plan Note (Signed)
Reviewed last lipid panel Not currently on a statin Recheck FLP and CMP Discussed diet and exercise  

## 2020-10-04 NOTE — Progress Notes (Signed)
Complete physical exam   Patient: Anita Cowan   DOB: 1970-02-25   50 y.o. Female  MRN: [id number] Visit Date: 10/04/2020  Today's healthcare provider: Lavon Paganini, MD   Chief Complaint  Patient presents with   Annual Exam   Subjective    Anita Cowan is a 50 y.o. female who presents today for a complete physical exam.  She reports consuming a general diet. Home exercise routine includes walking 3 hrs per week. She generally feels well. She reports sleeping well. She does have additional problems to discuss today.   HPI   Hypertension  She continues taking lisinopril 10 mg and HCTZ 25 mg for hypertension. Her blood pressure is within normal ranges.   Eye pain She feels pressure behind her eyes. Also there is pain when moving her eyes. She has had these symptoms in the past but the recent occurrence has been more painful. She denies sinus pressure and headaches. She does have an optometrist and that she will follow-up with.  Screenings  03/19/19 Pap/HPV-negative 06/10/20 Mammogram-biopsy/ fu in 6 months of left breast She is amenable for referral for a colonoscopy.  Vaccines She declines the tetanus vaccine at this time.   Past Medical History:  Diagnosis Date   BRCA negative 03/2019   IBIS=12.8%/riskscore=18.9%   Breast microcalcifications    Essential hypertension    Family history of breast cancer    History of kidney stones    Kidney stones    Lower extremity edema    Ovarian cyst    Past Surgical History:  Procedure Laterality Date   BREAST BIOPSY Left 06/10/2020   distortion, x marker, path pending   CHOLECYSTECTOMY     CHOLECYSTECTOMY, LAPAROSCOPIC     DILATATION & CURETTAGE/HYSTEROSCOPY WITH MYOSURE  07/08/2020   Procedure: [place] POLYPECTOMY;  Surgeon: Malachy Mood, MD;  Location: [facility] ORS;  Service: Gynecology;;   [place] N/A 08/03/2020    Procedure: HYSTEROSCOPY WITH NOVASURE ABLATION;  Surgeon: Malachy Mood, MD;  Location: [facility] ORS;  Service: Gynecology;  Laterality: N/A;   KNEE ARTHROSCOPY Right 1989   TONSILLECTOMY     TUBAL LIGATION     Social History   Socioeconomic History   Marital status: Married    Spouse name: Not on file   Number of children: 3   Years of education: Not on file   Highest education level: Not on file  Occupational History   Occupation: Pharmacist, hospital  Tobacco Use   Smoking status: Never   Smokeless tobacco: Never  Vaping Use   Vaping Use: Never used  Substance and Sexual Activity   Alcohol use: Yes    Comment: rarely   Drug use: Never   Sexual activity: Yes    Partners: Male    Birth control/protection: Surgical    Comment: Tubal ligation  Other Topics Concern   Not on file  Social History Narrative   Not on file   Social Determinants of Health   Financial Resource Strain: Not on file  Food Insecurity: Not on file  Transportation Needs: Not on file  Physical Activity: Not on file  Stress: Not on file  Social Connections: Not on file  Intimate Partner Violence: Not on file   Family Status  Relation Name Status   Mother  Alive   Sister  Alive   MGM  Deceased   Mat Uncle  Deceased   [place]  Deceased  Neg Hx  (Not Specified)   Family History  Problem Relation Age of Onset   Hypertension Mother    Lupus Mother        discoid   Cervical cancer Mother    Breast cancer Sister 76   Thyroid nodules Sister        thyroid tumor in her teens   Lung cancer Maternal Grandmother        smoker   Diabetes Maternal Uncle    Breast cancer Maternal Uncle        twice   Bladder Cancer Maternal Uncle    Lung cancer Maternal Uncle    Colon cancer Neg Hx    Allergies  Allergen Reactions   Codeine Hives   Tramadol Hives   Wheat Extract     Other reaction(s): GI Intolerance   Azithromycin Rash   Penicillins Rash    Reaction: 10 years ago   Sulfa  Antibiotics Rash    Patient Care Team: Virginia Crews, MD as PCP - General (Family Medicine)   Medications: Outpatient Medications Prior to Visit  Medication Sig   Brimonidine Tartrate (LUMIFY) 0.025 % SOLN Place 1 drop into both eyes daily as needed (red eyes).   [DISCONTINUED] hydrochlorothiazide (HYDRODIURIL) 25 MG tablet Take 1 tablet (25 mg total) by mouth in the morning.   [DISCONTINUED] lisinopril (ZESTRIL) 10 MG tablet TAKE ONE TABLET BY MOUTH DAILY   [DISCONTINUED] Apple Cid Vn-Grn Tea-Bit Or-Cr (APPLE CIDER VINEGAR PLUS PO) Take 1 each by mouth daily. (Patient not taking: Reported on 10/04/2020)   [DISCONTINUED] Ashwagandha 500 MG CAPS Take 500 mg by mouth daily. (Patient not taking: Reported on 10/04/2020)   [DISCONTINUED] cholecalciferol (VITAMIN D3) 25 MCG (1000 UNIT) tablet Take 1,000 Units by mouth daily. (Patient not taking: Reported on 10/04/2020)   [DISCONTINUED] ELDERBERRY PO Take 1 each by mouth daily. (Patient not taking: Reported on 10/04/2020)   [DISCONTINUED] Turmeric 500 MG CAPS Take 500 mg by mouth daily. (Patient not taking: Reported on 10/04/2020)   No facility-administered medications prior to visit.    Review of Systems  Constitutional:  Negative for chills, diaphoresis, fatigue and fever.  HENT:  Negative for ear pain, sinus pressure, sinus pain and sore throat.   Eyes:  Positive for pain. Negative for visual disturbance.  Respiratory:  Negative for cough, chest tightness, shortness of breath and wheezing.   Cardiovascular:  Negative for chest pain, palpitations and leg swelling.  Gastrointestinal:  Negative for abdominal pain, blood in stool, constipation, diarrhea, nausea and vomiting.  Genitourinary:  Negative for dysuria, flank pain, frequency, pelvic pain and urgency.  Musculoskeletal:  Negative for back pain, myalgias and neck pain.  Neurological:  Negative for dizziness, seizures, syncope, weakness, light-headedness, numbness and headaches.  All  other systems reviewed and are negative.  Last CBC Lab Results  Component Value Date   WBC 6.0 07/06/2020   HGB 14.3 07/06/2020   HCT 41.9 07/06/2020   MCV 91.1 07/06/2020   MCH 31.1 07/06/2020   RDW 12.4 07/06/2020   PLT 277 02/54/2706   Last metabolic panel Lab Results  Component Value Date   GLUCOSE 89 03/29/2020   NA 141 03/29/2020   K 3.9 03/29/2020   CL 102 03/29/2020   CO2 22 03/29/2020   BUN 15 03/29/2020   CREATININE 0.77 03/29/2020   GFRNONAA 98 10/27/2019   GFRAA 113 10/27/2019   CALCIUM 9.0 03/29/2020   PROT 6.8 03/29/2020   ALBUMIN 4.6 03/29/2020   LABGLOB 2.2 03/29/2020  AGRATIO 2.1 03/29/2020   BILITOT 0.2 03/29/2020   ALKPHOS 43 (L) 03/29/2020   AST 15 03/29/2020   ALT 11 03/29/2020   Last lipids Lab Results  Component Value Date   CHOL 225 (H) 03/29/2020   HDL 72 03/29/2020   LDLCALC 142 (H) 03/29/2020   TRIG 66 03/29/2020   CHOLHDL 3.1 03/29/2020   Last vitamin D Lab Results  Component Value Date   VD25OH 48.8 03/17/2019   Last vitamin B12 and Folate Lab Results  Component Value Date   VITAMINB12 1,917 (H) 03/17/2019      Objective    BP 121/81 (BP Location: Left Arm, Patient Position: Sitting, Cuff Size: Normal)   Pulse 76   Temp 98 F (36.7 C) (Oral)   Resp 16   Ht '5\' 6"'  (1.676 m)   Wt 152 lb 14.4 oz (69.4 kg)   BMI 24.68 kg/m  BP Readings from Last 3 Encounters:  10/04/20 121/81  09/20/20 120/72  08/13/20 110/66   Wt Readings from Last 3 Encounters:  10/04/20 152 lb 14.4 oz (69.4 kg)  09/20/20 153 lb (69.4 kg)  08/13/20 151 lb (68.5 kg)      Physical Exam Vitals reviewed.  Constitutional:      General: She is not in acute distress.    Appearance: Normal appearance. She is well-developed. She is not diaphoretic.  HENT:     Head: Normocephalic and atraumatic.     Right Ear: Tympanic membrane, ear canal and external ear normal.     Left Ear: Tympanic membrane, ear canal and external ear normal.     Nose: Nose  normal.     Mouth/Throat:     Mouth: Mucous membranes are moist.     Pharynx: Oropharynx is clear. No oropharyngeal exudate.  Eyes:     General: No scleral icterus.    Conjunctiva/sclera: Conjunctivae normal.     Pupils: Pupils are equal, round, and reactive to light.  Neck:     Thyroid: No thyromegaly.  Cardiovascular:     Rate and Rhythm: Normal rate and regular rhythm.     Pulses: Normal pulses.     Heart sounds: Normal heart sounds. No murmur heard. Pulmonary:     Effort: Pulmonary effort is normal. No respiratory distress.     Breath sounds: Normal breath sounds. No wheezing or rales.  Abdominal:     General: There is no distension.     Palpations: Abdomen is soft.     Tenderness: There is no abdominal tenderness.  Musculoskeletal:        General: No deformity.     Cervical back: Neck supple.     Right lower leg: No edema.     Left lower leg: No edema.  Lymphadenopathy:     Cervical: No cervical adenopathy.  Skin:    General: Skin is warm and dry.     Findings: No rash.  Neurological:     Mental Status: She is alert and oriented to person, place, and time. Mental status is at baseline.     Sensory: No sensory deficit.     Motor: No weakness.     Gait: Gait normal.  Psychiatric:        Mood and Affect: Mood normal.        Behavior: Behavior normal.        Thought Content: Thought content normal.      Last depression screening scores PHQ 2/9 Scores 10/04/2020 03/29/2020 03/03/2019  PHQ - 2 Score 0 0  0  PHQ- 9 Score 0 0 0   Last fall risk screening Fall Risk  10/04/2020  Falls in the past year? 0  Number falls in past yr: 0  Injury with Fall? 0  Risk for fall due to : No Fall Risks  Follow up Falls evaluation completed   Last Audit-C alcohol use screening Alcohol Use Disorder Test (AUDIT) 10/04/2020  1. How often do you have a drink containing alcohol? 2  2. How many drinks containing alcohol do you have on a typical day when you are drinking? 0  3. How often do  you have six or more drinks on one occasion? 0  AUDIT-C Score 2   A score of 3 or more in women, and 4 or more in men indicates increased risk for alcohol abuse, EXCEPT if all of the points are from question 1   No results found for any visits on 10/04/20.  Assessment & Plan     Problem List Items Addressed This Visit       Cardiovascular and Mediastinum   Essential hypertension    Well controlled Continue current medications Recheck metabolic panel F/u in 6 months       Relevant Medications   lisinopril (ZESTRIL) 10 MG tablet   hydrochlorothiazide (HYDRODIURIL) 25 MG tablet   Other Relevant Orders   Comprehensive metabolic panel     Other   Moderate mixed hyperlipidemia not requiring statin therapy    Reviewed last lipid panel Not currently on a statin Recheck FLP and CMP Discussed diet and exercise       Relevant Medications   lisinopril (ZESTRIL) 10 MG tablet   hydrochlorothiazide (HYDRODIURIL) 25 MG tablet   Other Relevant Orders   Lipid Panel With LDL/HDL Ratio   Other Visit Diagnoses     Annual physical exam    -  Primary   Relevant Orders   Comprehensive metabolic panel   Lipid Panel With LDL/HDL Ratio   Avitaminosis D       Relevant Orders   VITAMIN D 25 Hydroxy (Vit-D Deficiency, Fractures)   B12 deficiency       Relevant Orders   Vitamin B12   Colon cancer screening       Relevant Orders   Ambulatory referral to Gastroenterology   Abnormal mammogram       Relevant Orders   MM Digital Diagnostic Unilat L       Routine Health Maintenance and Physical Exam  Exercise Activities and Dietary recommendations  Goals   None     Immunization History  Administered Date(s) Administered   Janssen (J&J) SARS-COV-2 Vaccination 05/23/2019    Health Maintenance  Topic Date Due   COLONOSCOPY (Pts 45-34yr Insurance coverage will need to be confirmed)  Never done   COVID-19 Vaccine (2 - Booster for [employer]) 10/06/2020 (Originally 07/18/2019)    INFLUENZA VACCINE  04/22/2021 (Originally 08/23/2020)   TETANUS/TDAP  06/09/2021 (Originally 11/01/1990)   PAP SMEAR-Modifier  03/18/2024   Hepatitis C Screening  Completed   HIV Screening  Completed   Pneumococcal Vaccine [id number] old  Aged Out   HPV VACCINES  Aged Out    Discussed health benefits of physical activity, and encouraged her to engage in regular exercise appropriate for her age and condition.    Return in about 6 months (around 04/03/2021) for chronic disease f/u.     I,Essence Turner,acting as a sEducation administratorfor ALavon Paganini MD.,have documented all relevant documentation on the behalf of ALavon Paganini MD,as  directed by  Lavon Paganini, MD while in the presence of Lavon Paganini, MD.  I, Lavon Paganini, MD, have reviewed all documentation for this visit. The documentation on 10/04/20 for the exam, diagnosis, procedures, and orders are all accurate and complete.   , Dionne Bucy, MD, MPH [facility]

## 2020-12-02 ENCOUNTER — Ambulatory Visit
Admission: RE | Admit: 2020-12-02 | Discharge: 2020-12-02 | Disposition: A | Discharge status: Home / Self Care | Payer: BC Managed Care – PPO | Source: Ambulatory Visit | Attending: Family Medicine | Admitting: Family Medicine

## 2020-12-02 ENCOUNTER — Other Ambulatory Visit: Payer: Self-pay

## 2020-12-02 DIAGNOSIS — R928 Other abnormal and inconclusive findings on diagnostic imaging of breast: Secondary | ICD-10-CM | POA: Diagnosis present

## 2021-04-01 NOTE — Progress Notes (Signed)
?  ? ?I,Sulibeya S Dimas,acting as a scribe for Lavon Paganini, MD.,have documented all relevant documentation on the behalf of Lavon Paganini, MD,as directed by  Lavon Paganini, MD while in the presence of Lavon Paganini, MD. ? ? ?Established patient visit ? ? ?Patient: Anita Cowan   DOB: [birth year]   51 y.o. Female  MRN: [id number] ?Visit Date: 04/04/2021 ? ?Today's healthcare provider: Lavon Paganini, MD  ? ?Chief Complaint  ?Patient presents with  ? Hypertension  ? Hyperlipidemia  ? ?Subjective  ?  ?HPI  ?Hypertension, follow-up ? ?BP Readings from Last 3 Encounters:  ?04/04/21 130/80  ?10/04/20 121/81  ?09/20/20 120/72  ? Wt Readings from Last 3 Encounters:  ?04/04/21 152 lb (68.9 kg)  ?10/04/20 152 lb 14.4 oz (69.4 kg)  ?09/20/20 153 lb (69.4 kg)  ?  ? ?She was last seen for hypertension 6 months ago.  ?BP at that visit was 121/81. ?Management since that visit includes no changes. ?She reports excellent compliance with treatment. No longer taking lisinopril. ?She is not having side effects.  ?She is not exercising. ?She is not adherent to low salt diet.   ?Outside blood pressures are stable 130/80s. ? ?She does not smoke. ? ?Use of agents associated with hypertension: none.  ? ?--------------------------------------------------------------------------------------------------- ?Lipid/Cholesterol, follow-up ? ?Last Lipid Panel: ?Lab Results  ?Component Value Date  ? CHOL 225 (H) 03/29/2020  ? LDLCALC 142 (H) 03/29/2020  ? HDL 72 03/29/2020  ? TRIG 66 03/29/2020  ? ? ?She was last seen for this 6 months ago.  ?Management since that visit includes no changes. ? ?She reports excellent compliance with treatment. ?She is not having side effects.  ? ?Symptoms: ?No appetite changes No foot ulcerations  ?No chest pain No chest pressure/discomfort  ?No dyspnea No orthopnea  ?No fatigue Yes lower extremity edema  ?No palpitations No paroxysmal nocturnal dyspnea  ?No nausea No numbness or tingling of  extremity  ?No polydipsia No polyuria  ?No speech difficulty No syncope  ? ?She is following a Regular diet. ?Current exercise: none ? ?Last metabolic panel ?Lab Results  ?Component Value Date  ? GLUCOSE 89 03/29/2020  ? NA 141 03/29/2020  ? K 3.9 03/29/2020  ? BUN 15 03/29/2020  ? CREATININE 0.77 03/29/2020  ? EGFR 95 03/29/2020  ? GFRNONAA 98 10/27/2019  ? CALCIUM 9.0 03/29/2020  ? AST 15 03/29/2020  ? ALT 11 03/29/2020  ? ?The 10-year ASCVD risk score (Arnett DK, et al., 2019) is: 1.3% ? ? ?R flank pain and hematuria since yesterday.  She has h/o kidney stones in the past and this feels similar. Never needed procedures and was able to pass them independently several years ago.  No dysuria ?--------------------------------------------------------------------------------------------------- ? ? ?Medications: ?Outpatient Medications Prior to Visit  ?Medication Sig  ? Brimonidine Tartrate (LUMIFY) 0.025 % SOLN Place 1 drop into both eyes daily as needed (red eyes).  ? hydrochlorothiazide (HYDRODIURIL) 25 MG tablet Take 1 tablet (25 mg total) by mouth in the morning.  ? [DISCONTINUED] lisinopril (ZESTRIL) 10 MG tablet Take 1 tablet (10 mg total) by mouth daily.  ? ?No facility-administered medications prior to visit.  ? ? ?Review of Systems  ?Constitutional:  Negative for appetite change and fatigue.  ?Respiratory:  Negative for chest tightness and shortness of breath.   ?Cardiovascular:  Positive for leg swelling. Negative for chest pain and palpitations.  ?Musculoskeletal:  Positive for back pain.  ? ?  ?  Objective  ?  ?BP 130/80 Comment: home readings  Pulse 66   Temp 98 ?F (36.7 ?C) (Temporal)   Resp 16   Wt 152 lb (68.9 kg)   SpO2 99%   BMI 24.53 kg/m?  ?BP Readings from Last 3 Encounters:  ?04/04/21 130/80  ?10/04/20 121/81  ?09/20/20 120/72  ? ?Wt Readings from Last 3 Encounters:  ?04/04/21 152 lb (68.9 kg)  ?10/04/20 152 lb 14.4 oz (69.4 kg)  ?09/20/20 153 lb (69.4 kg)  ? ?  ? ?Physical Exam ?Vitals  reviewed.  ?Constitutional:   ?   General: She is not in acute distress. ?   Appearance: Normal appearance. She is well-developed. She is not diaphoretic.  ?HENT:  ?   Head: Normocephalic and atraumatic.  ?Eyes:  ?   General: No scleral icterus. ?   Conjunctiva/sclera: Conjunctivae normal.  ?Neck:  ?   Thyroid: No thyromegaly.  ?Cardiovascular:  ?   Rate and Rhythm: Normal rate and regular rhythm.  ?   Pulses: Normal pulses.  ?   Heart sounds: Normal heart sounds. No murmur heard. ?Pulmonary:  ?   Effort: Pulmonary effort is normal. No respiratory distress.  ?   Breath sounds: Normal breath sounds. No wheezing, rhonchi or rales.  ?Musculoskeletal:  ?   Cervical back: Neck supple.  ?   Right lower leg: No edema.  ?   Left lower leg: No edema.  ?Lymphadenopathy:  ?   Cervical: No cervical adenopathy.  ?Skin: ?   General: Skin is warm and dry.  ?   Findings: No rash.  ?Neurological:  ?   Mental Status: She is alert and oriented to person, place, and time. Mental status is at baseline.  ?Psychiatric:     ?   Mood and Affect: Mood normal.     ?   Behavior: Behavior normal.  ?  ? ? ?Results for orders placed or performed in visit on 04/04/21  ?POCT Urinalysis Dipstick  ?Result Value Ref Range  ? Color, UA yellow   ? Clarity, UA clear   ? Glucose, UA Negative Negative  ? Bilirubin, UA Negative   ? Ketones, UA Negative   ? Spec Grav, UA <=1.005 (A) 1.010 - 1.025  ? Blood, UA Negative   ? pH, UA 7.0 5.0 - 8.0  ? Protein, UA Negative Negative  ? Urobilinogen, UA 0.2 0.2 or 1.0 E.U./dL  ? Nitrite, UA Negative   ? Leukocytes, UA Negative Negative  ? ? Assessment & Plan  ?  ? ?Problem List Items Addressed This Visit   ? ?  ? Cardiovascular and Mediastinum  ? Essential hypertension - Primary  ?  Well controlled ?Continue current medications ?Recheck metabolic panel ?F/u in 6 months  ?  ?  ? Relevant Orders  ? Comprehensive metabolic panel  ?  ? Other  ? Moderate mixed hyperlipidemia not requiring statin therapy  ?  Reviewed last  lipid panel ?Not currently on a statin ?Recheck FLP and CMP ?Discussed diet and exercise  ?  ?  ? Relevant Orders  ? Comprehensive metabolic panel  ? Lipid panel  ? Right flank pain  ?  No signs of UTI on UA ?Concern for nephrolithiasis given history and symptoms ?Check KUB ?Consider flomax and pain medications pending results ?May need urology referral pending results ?  ?  ? Relevant Orders  ? DG Abd 1 View  ? POCT Urinalysis Dipstick (Completed)  ? ?Other Visit Diagnoses   ? ? Colon cancer screening      ? Relevant Orders  ?  Ambulatory referral to Gastroenterology  ? ?  ?  ? ?Return in about 6 months (around 10/05/2021) for CPE.  ?   ? ?I, Lavon Paganini, MD, have reviewed all documentation for this visit. The documentation on 04/04/21 for the exam, diagnosis, procedures, and orders are all accurate and complete. ? ? ?Virginia Crews, MD, MPH ?[place] ?[facility]   ?

## 2021-04-04 ENCOUNTER — Encounter: Payer: Self-pay | Admitting: Family Medicine

## 2021-04-04 ENCOUNTER — Ambulatory Visit
Admission: RE | Admit: 2021-04-04 | Discharge: 2021-04-04 | Disposition: A | Discharge status: Home / Self Care | Payer: BC Managed Care – PPO | Attending: Family Medicine | Admitting: Family Medicine

## 2021-04-04 ENCOUNTER — Ambulatory Visit: Payer: BC Managed Care – PPO | Admitting: Family Medicine

## 2021-04-04 ENCOUNTER — Ambulatory Visit
Admission: RE | Admit: 2021-04-04 | Discharge: 2021-04-04 | Disposition: A | Discharge status: Home / Self Care | Payer: BC Managed Care – PPO | Source: Ambulatory Visit | Attending: Family Medicine | Admitting: Family Medicine

## 2021-04-04 ENCOUNTER — Other Ambulatory Visit: Payer: Self-pay

## 2021-04-04 VITALS — BP 130/80 | HR 66 | Temp 98.0°F | Resp 16 | Wt 152.0 lb

## 2021-04-04 DIAGNOSIS — Z1211 Encounter for screening for malignant neoplasm of colon: Secondary | ICD-10-CM

## 2021-04-04 DIAGNOSIS — R109 Unspecified abdominal pain: Secondary | ICD-10-CM | POA: Insufficient documentation

## 2021-04-04 DIAGNOSIS — I10 Essential (primary) hypertension: Secondary | ICD-10-CM

## 2021-04-04 DIAGNOSIS — E782 Mixed hyperlipidemia: Secondary | ICD-10-CM | POA: Diagnosis not present

## 2021-04-04 LAB — POCT URINALYSIS DIPSTICK
Bilirubin, UA: NEGATIVE
Blood, UA: NEGATIVE
Glucose, UA: NEGATIVE
Ketones, UA: NEGATIVE
Leukocytes, UA: NEGATIVE
Nitrite, UA: NEGATIVE
Protein, UA: NEGATIVE
Spec Grav, UA: <=1.005 — AB (ref 1.010–1.025)
Urobilinogen, UA: 0.2 E.U./dL
pH, UA: 7 (ref 5.0–8.0)

## 2021-04-04 MED ORDER — NA SULFATE-K SULFATE-MG SULF 17.5-3.13-1.6 GM/177ML PO SOLN: 1.0000 | Freq: Once | ORAL | 0 refills | Status: AC

## 2021-04-04 NOTE — Assessment & Plan Note (Signed)
Reviewed last lipid panel Not currently on a statin Recheck FLP and CMP Discussed diet and exercise  

## 2021-04-04 NOTE — Assessment & Plan Note (Signed)
Well controlled Continue current medications Recheck metabolic panel F/u in 6 months  

## 2021-04-04 NOTE — Assessment & Plan Note (Signed)
No signs of UTI on UA ?Concern for nephrolithiasis given history and symptoms ?Check KUB ?Consider flomax and pain medications pending results ?May need urology referral pending results ?

## 2021-04-04 NOTE — Progress Notes (Unsigned)
Gastroenter/ology Pre-Procedure Review ? ?Request Date: 07/11/2021 ?Requesting Physician: Dr. Marius Ditch ? ?PATIENT REVIEW QUESTIONS: The patient responded to the following health history questions as indicated:   ? ?1. Are you having any GI issues? no ?2. Do you have a personal history of Polyps? No ? ?3. Do you have a family history of Colon Cancer or Polyps? no ?4. Diabetes Mellitus? no ?5. Joint replacements in the past 12 months?no ?6. Major health problems in the past 3 months?no ?7. Any artificial heart valves, MVP, or defibrillator?no ?   ?MEDICATIONS & ALLERGIES:    ?Patient reports the following regarding taking any anticoagulation/antiplatelet therapy:   ?Plavix, Coumadin, Eliquis, Xarelto, Lovenox, Pradaxa, Brilinta, or Effient? no ?Aspirin? no ? ?Patient confirms/reports the following medications:  ?Current Outpatient Medications  ?Medication Sig Dispense Refill  ? Brimonidine Tartrate (LUMIFY) 0.025 % SOLN Place 1 drop into both eyes daily as needed (red eyes).    ? hydrochlorothiazide (HYDRODIURIL) 25 MG tablet Take 1 tablet (25 mg total) by mouth in the morning. 90 tablet 3  ? ?No current facility-administered medications for this visit.  ? ? ?Patient confirms/reports the following allergies:  ?Allergies  ?Allergen Reactions  ? Codeine Hives  ? Tramadol Hives  ? Wheat Extract   ?  Other reaction(s): GI Intolerance  ? Azithromycin Rash  ? Penicillins Rash  ?  Reaction: 10 years ago  ? Sulfa Antibiotics Rash  ? ? ?No orders of the defined types were placed in this encounter. ? ? ?AUTHORIZATION INFORMATION ?Primary Insurance: ?1D#: ?Group #: ? ?Secondary Insurance: ?1D#: ?Group #: ? ?SCHEDULE INFORMATION: ?Date: 07/11/2021 ?Time: ?Location:[facility] ? ?

## 2021-04-05 ENCOUNTER — Encounter: Payer: Self-pay | Admitting: Family Medicine

## 2021-04-05 LAB — COMPREHENSIVE METABOLIC PANEL
ALT: 12 IU/L (ref 0–32)
AST: 17 IU/L (ref 0–40)
Albumin/Globulin Ratio: 2.1 (ref 1.2–2.2)
Albumin: 5 g/dL — ABNORMAL HIGH (ref 3.8–4.8)
Alkaline Phosphatase: 58 IU/L (ref 44–121)
BUN/Creatinine Ratio: 16 (ref 9–23)
BUN: 13 mg/dL (ref 6–24)
Bilirubin Total: 0.4 mg/dL (ref 0.0–1.2)
CO2: 27 mmol/L (ref 20–29)
Calcium: 9.8 mg/dL (ref 8.7–10.2)
Chloride: 97 mmol/L (ref 96–106)
Creatinine, Ser: 0.8 mg/dL (ref 0.57–1.00)
Globulin, Total: 2.4 g/dL (ref 1.5–4.5)
Glucose: 95 mg/dL (ref 70–99)
Potassium: 3.5 mmol/L (ref 3.5–5.2)
Sodium: 138 mmol/L (ref 134–144)
Total Protein: 7.4 g/dL (ref 6.0–8.5)
eGFR: 90 mL/min/{1.73_m2} (ref >59)

## 2021-04-05 LAB — LIPID PANEL
Chol/HDL Ratio: 3.4 ratio (ref 0.0–4.4)
Cholesterol, Total: 225 mg/dL — ABNORMAL HIGH (ref 100–199)
HDL: 67 mg/dL (ref >39)
LDL Chol Calc (NIH): 135 mg/dL — ABNORMAL HIGH (ref 0–99)
Triglycerides: 131 mg/dL (ref 0–149)
VLDL Cholesterol Cal: 23 mg/dL (ref 5–40)

## 2021-04-07 MED ORDER — HYDROCODONE-ACETAMINOPHEN 5-325 MG PO TABS: 1.0000 | ORAL_TABLET | Freq: Four times a day (QID) | ORAL | 0 refills | Status: DC | PRN

## 2021-06-13 ENCOUNTER — Other Ambulatory Visit: Payer: Self-pay | Admitting: Obstetrics and Gynecology

## 2021-06-13 DIAGNOSIS — Z1231 Encounter for screening mammogram for malignant neoplasm of breast: Secondary | ICD-10-CM

## 2021-06-16 ENCOUNTER — Ambulatory Visit
Admission: RE | Admit: 2021-06-16 | Discharge: 2021-06-16 | Disposition: A | Discharge status: Home / Self Care | Payer: BC Managed Care – PPO | Source: Ambulatory Visit | Attending: Obstetrics and Gynecology | Admitting: Obstetrics and Gynecology

## 2021-06-16 DIAGNOSIS — Z1231 Encounter for screening mammogram for malignant neoplasm of breast: Secondary | ICD-10-CM | POA: Diagnosis present

## 2021-06-24 ENCOUNTER — Telehealth: Payer: Self-pay

## 2021-06-24 NOTE — Telephone Encounter (Signed)
Patient called to reschedule procedure so I called endo and sent new letters and new referral

## 2021-07-06 NOTE — Progress Notes (Signed)
PCP:  Virginia Crews, MD   Chief Complaint  Patient presents with   Gynecologic Exam    No concerns     HPI:      Ms. Anita Cowan is a 51 y.o. No obstetric history on file. who LMP was No LMP recorded. Patient has had an ablation., presents today for her annual examination.  Her menses are absent now due to novasure ablation  6/22 with Dr. Marga Melnick. Pt was having menorrhagia and severe dysmen since TL. Doing very well now, min dysmen. Very happy with ablation.   Sex activity: single partner, contraception - tubal ligation. No postcoital bleeding/pain. Last Pap: 03/19/19 Results were normal/neg HPV DNA. No hx of abn paps. Hx of STDs: none  Last mammogram: 06/16/21 Results were normal. Has been followed for LT breast microcalcifications for a few yrs. There is a FH of breast cancer in her sister, now precancerous breast cells in her mom, and mat uncle twice. Her sister is BRCA neg 2 yrs ago but pt doesn't know if had panel testing. Pt is MyRisk neg 3/21. IBIS=12.8%/riskscore =18.9%. There is no FH of ovarian cancer. The patient does do self-breast exams.  Tobacco use: The patient denies current or previous tobacco use. Alcohol use: none No drug use.  Exercise: moderately active  Colonoscopy: scheduled 7/23 with [facility]  She does get adequate calcium and Vitamin D in her diet.  Labs with PCP  Past Medical History:  Diagnosis Date   BRCA negative 03/2019   IBIS=12.8%/riskscore=18.9%   Breast microcalcifications    Essential hypertension    Family history of breast cancer    History of kidney stones    Kidney stones    Lower extremity edema    Ovarian cyst     Past Surgical History:  Procedure Laterality Date   BREAST BIOPSY Left 06/10/2020   distortion, x marker, path pending   CHOLECYSTECTOMY     CHOLECYSTECTOMY, LAPAROSCOPIC     DILATATION & CURETTAGE/HYSTEROSCOPY WITH MYOSURE  07/08/2020   Procedure: [place]  POLYPECTOMY;  Surgeon: Malachy Mood, MD;  Location: [facility] ORS;  Service: Gynecology;;   [place] N/A 08/03/2020   Procedure: HYSTEROSCOPY WITH NOVASURE ABLATION;  Surgeon: Malachy Mood, MD;  Location: [facility] ORS;  Service: Gynecology;  Laterality: N/A;   KNEE ARTHROSCOPY Right 1989   TONSILLECTOMY     TUBAL LIGATION      Family History  Problem Relation Age of Onset   Hypertension Mother    Lupus Mother        discoid   Cervical cancer Mother    Breast cancer Sister 3   Thyroid nodules Sister        thyroid tumor in her teens   Lung cancer Maternal Grandmother        smoker   Diabetes Maternal Uncle    Breast cancer Maternal Uncle        twice   Bladder Cancer Maternal Uncle    Lung cancer Maternal Uncle    Colon cancer Neg Hx     Social History   Socioeconomic History   Marital status: Married    Spouse name: Not on file   Number of children: 3   Years of education: Not on file   Highest education level: Not on file  Occupational History   Occupation: Pharmacist, hospital  Tobacco Use   Smoking status: Never   Smokeless tobacco: Never  Vaping Use   Vaping Use: Never  used  Substance and Sexual Activity   Alcohol use: Yes    Comment: rarely   Drug use: Never   Sexual activity: Yes    Partners: Male    Birth control/protection: Surgical    Comment: Tubal ligation  Other Topics Concern   Not on file  Social History Narrative   Not on file   Social Determinants of Health   Financial Resource Strain: Not on file  Food Insecurity: Not on file  Transportation Needs: Not on file  Physical Activity: Not on file  Stress: Not on file  Social Connections: Not on file  Intimate Partner Violence: Not on file     Current Outpatient Medications:    Brimonidine Tartrate (LUMIFY) 0.025 % SOLN, Place 1 drop into both eyes daily as needed (red eyes)., Disp: , Rfl:    hydrochlorothiazide (HYDRODIURIL) 25 MG tablet, Take 1  tablet (25 mg total) by mouth in the morning., Disp: 90 tablet, Rfl: 3     ROS:  Review of Systems  Constitutional:  Negative for fatigue, fever and unexpected weight change.  Respiratory:  Negative for cough, shortness of breath and wheezing.   Cardiovascular:  Negative for chest pain, palpitations and leg swelling.  Gastrointestinal:  Negative for blood in stool, constipation, diarrhea, nausea and vomiting.  Endocrine: Negative for cold intolerance, heat intolerance and polyuria.  Genitourinary:  Negative for dyspareunia, dysuria, flank pain, frequency, genital sores, hematuria, menstrual problem, pelvic pain, urgency, vaginal bleeding, vaginal discharge and vaginal pain.  Musculoskeletal:  Negative for back pain, joint swelling and myalgias.  Skin:  Negative for rash.  Neurological:  Negative for dizziness, syncope, light-headedness, numbness and headaches.  Hematological:  Negative for adenopathy.  Psychiatric/Behavioral:  Negative for agitation, confusion, sleep disturbance and suicidal ideas. The patient is not nervous/anxious.   BREAST: No symptoms   Objective: BP 110/70   Ht '5\' 6"'  (1.676 m)   Wt 149 lb (67.6 kg)   BMI 24.05 kg/m    Physical Exam Constitutional:      Appearance: She is well-developed.  Genitourinary:     Vulva normal.     Right Labia: No rash, tenderness or lesions.    Left Labia: No tenderness, lesions or rash.    No vaginal discharge, erythema or tenderness.      Right Adnexa: not tender and no mass present.    Left Adnexa: not tender and no mass present.    No cervical friability or polyp.     Uterus is not enlarged or tender.  Breasts:    Right: No mass, nipple discharge, skin change or tenderness.     Left: No mass, nipple discharge, skin change or tenderness.  Neck:     Thyroid: No thyromegaly.  Cardiovascular:     Rate and Rhythm: Normal rate and regular rhythm.     Heart sounds: Normal heart sounds. No murmur heard. Pulmonary:      Effort: Pulmonary effort is normal.     Breath sounds: Normal breath sounds.  Abdominal:     Palpations: Abdomen is soft.     Tenderness: There is no abdominal tenderness. There is no guarding or rebound.  Musculoskeletal:        General: Normal range of motion.     Cervical back: Normal range of motion.  Lymphadenopathy:     Cervical: No cervical adenopathy.  Neurological:     General: No focal deficit present.     Mental Status: She is alert and oriented to person, place, and  time.     Cranial Nerves: No cranial nerve deficit.  Skin:    General: Skin is warm and dry.  Psychiatric:        Mood and Affect: Mood normal.        Behavior: Behavior normal.        Thought Content: Thought content normal.        Judgment: Judgment normal.  Vitals reviewed.    Assessment/Plan: Encounter for annual routine gynecological examination  Encounter for screening mammogram for malignant neoplasm of breast; pt current on mammo  Family history of breast cancer--Pt is MyRisk neg; pt to discuss panel testing with sister to confirm done. Mom now with precancerous breast cells requiring tamoxifen tx.   Screening for colon cancer--pt has upcoming colonosocpy            GYN counsel breast self exam, mammography screening, adequate intake of calcium and vitamin D, diet and exercise     F/U  Return in about 1 year (around 07/08/2022).   B. , PA-C 07/07/2021 10:50 AM

## 2021-07-07 ENCOUNTER — Ambulatory Visit (INDEPENDENT_AMBULATORY_CARE_PROVIDER_SITE_OTHER): Payer: BC Managed Care – PPO | Admitting: Obstetrics and Gynecology

## 2021-07-07 ENCOUNTER — Encounter: Payer: Self-pay | Admitting: Obstetrics and Gynecology

## 2021-07-07 VITALS — BP 110/70 | Ht 66.0 in | Wt 149.0 lb

## 2021-07-07 DIAGNOSIS — Z1231 Encounter for screening mammogram for malignant neoplasm of breast: Secondary | ICD-10-CM

## 2021-07-07 DIAGNOSIS — Z803 Family history of malignant neoplasm of breast: Secondary | ICD-10-CM | POA: Diagnosis not present

## 2021-07-07 DIAGNOSIS — Z01419 Encounter for gynecological examination (general) (routine) without abnormal findings: Secondary | ICD-10-CM | POA: Diagnosis not present

## 2021-07-07 DIAGNOSIS — Z1211 Encounter for screening for malignant neoplasm of colon: Secondary | ICD-10-CM

## 2021-07-07 NOTE — Patient Instructions (Signed)
I value your feedback and you entrusting us with your care. If you get a [facility] patient survey, I would appreciate you taking the time to let us know about your experience today. Thank you! ? ? ?

## 2021-07-29 ENCOUNTER — Encounter: Payer: Self-pay | Admitting: Gastroenterology

## 2021-08-01 ENCOUNTER — Ambulatory Visit: Payer: BC Managed Care – PPO | Admitting: Anesthesiology

## 2021-08-01 ENCOUNTER — Ambulatory Visit
Admission: RE | Admit: 2021-08-01 | Discharge: 2021-08-01 | Disposition: A | Discharge status: Home / Self Care | Payer: BC Managed Care – PPO | Attending: Gastroenterology | Admitting: Gastroenterology

## 2021-08-01 ENCOUNTER — Encounter
Admission: RE | Disposition: A | Discharge status: Home / Self Care | Payer: Self-pay | Source: Home / Self Care | Attending: Gastroenterology

## 2021-08-01 ENCOUNTER — Encounter: Payer: Self-pay | Admitting: Gastroenterology

## 2021-08-01 DIAGNOSIS — Z9049 Acquired absence of other specified parts of digestive tract: Secondary | ICD-10-CM | POA: Diagnosis not present

## 2021-08-01 DIAGNOSIS — D124 Benign neoplasm of descending colon: Secondary | ICD-10-CM | POA: Diagnosis not present

## 2021-08-01 DIAGNOSIS — I10 Essential (primary) hypertension: Secondary | ICD-10-CM | POA: Insufficient documentation

## 2021-08-01 DIAGNOSIS — Z1211 Encounter for screening for malignant neoplasm of colon: Secondary | ICD-10-CM

## 2021-08-01 HISTORY — PX: COLONOSCOPY WITH PROPOFOL: SHX5780

## 2021-08-01 SURGERY — COLONOSCOPY WITH PROPOFOL
Anesthesia: General

## 2021-08-01 MED ORDER — PROPOFOL 500 MG/50ML IV EMUL
INTRAVENOUS | Status: DC | PRN
  Administered 2021-08-01: 175 ug/kg/min via INTRAVENOUS

## 2021-08-01 MED ORDER — PROPOFOL 10 MG/ML IV BOLUS
INTRAVENOUS | Status: DC | PRN
  Administered 2021-08-01: 80 mg via INTRAVENOUS

## 2021-08-01 MED ORDER — LIDOCAINE HCL (CARDIAC) PF 100 MG/5ML IV SOSY
PREFILLED_SYRINGE | INTRAVENOUS | Status: DC | PRN
  Administered 2021-08-01: 60 mg via INTRAVENOUS

## 2021-08-01 MED ORDER — LIDOCAINE HCL (PF) 1 % IJ SOLN
INTRAMUSCULAR | Status: AC
  Filled 2021-08-01: qty 2

## 2021-08-01 MED ORDER — PROPOFOL 1000 MG/100ML IV EMUL
INTRAVENOUS | Status: AC
  Filled 2021-08-01: qty 100

## 2021-08-01 MED ORDER — LIDOCAINE HCL (PF) 2 % IJ SOLN
INTRAMUSCULAR | Status: AC
  Filled 2021-08-01: qty 5

## 2021-08-01 MED ORDER — SODIUM CHLORIDE 0.9 % IV SOLN
INTRAVENOUS | Status: DC
  Administered 2021-08-01: 1000 mL via INTRAVENOUS

## 2021-08-01 NOTE — H&P (Signed)
Anita Darby, MD [address]  [place]  [place], [place] [address]  Main: [phone]  Fax: [phone] Pager: [phone]  Primary Care Physician:  Virginia Crews, MD Primary Gastroenterologist:  Dr. Cephas Cowan  Pre-Procedure History & Physical: HPI:  Anita Cowan is a 51 y.o. female is here for an colonoscopy.   Past Medical History:  Diagnosis Date   BRCA negative 03/2019   IBIS=12.8%/riskscore=18.9%   Breast microcalcifications    Essential hypertension    Family history of breast cancer    History of kidney stones    Lower extremity edema    Ovarian cyst     Past Surgical History:  Procedure Laterality Date   BREAST BIOPSY Left 06/10/2020   distortion, x marker, path pending   CHOLECYSTECTOMY     CHOLECYSTECTOMY, LAPAROSCOPIC     DILATATION & CURETTAGE/HYSTEROSCOPY WITH MYOSURE  07/08/2020   Procedure: [place] POLYPECTOMY;  Surgeon: Malachy Mood, MD;  Location: [facility] ORS;  Service: Gynecology;;   [place] N/A 08/03/2020   Procedure: HYSTEROSCOPY WITH NOVASURE ABLATION;  Surgeon: Malachy Mood, MD;  Location: [facility] ORS;  Service: Gynecology;  Laterality: N/A;   KNEE ARTHROSCOPY Right 1989   TONSILLECTOMY     TUBAL LIGATION      Prior to Admission medications   Medication Sig Start Date End Date Taking? Authorizing Provider  hydrochlorothiazide (HYDRODIURIL) 25 MG tablet Take 1 tablet (25 mg total) by mouth in the morning. 10/04/20  Yes Bacigalupo, Dionne Bucy, MD  Brimonidine Tartrate (LUMIFY) 0.025 % SOLN Place 1 drop into both eyes daily as needed (red eyes). Patient not taking: Reported on 08/01/2021    [provider]    Allergies as of 04/04/2021 - Review Complete 04/04/2021  Allergen Reaction Noted   Codeine Hives 10/31/2013   Tramadol Hives 10/31/2013   Wheat extract  04/03/2015   Azithromycin Rash 10/31/2013   Penicillins Rash  10/31/2013   Sulfa antibiotics Rash 10/31/2013    Family History  Problem Relation Age of Onset   Hypertension Mother    Lupus Mother        discoid   Cervical cancer Mother    Breast cancer Sister 63   Thyroid nodules Sister        thyroid tumor in her teens   Lung cancer Maternal Grandmother        smoker   Diabetes Maternal Uncle    Breast cancer Maternal Uncle        twice   Bladder Cancer Maternal Uncle    Lung cancer Maternal Uncle    Colon cancer Neg Hx     Social History   Socioeconomic History   Marital status: Married    Spouse name: Not on file   Number of children: 3   Years of education: Not on file   Highest education level: Not on file  Occupational History   Occupation: Pharmacist, hospital  Tobacco Use   Smoking status: Never   Smokeless tobacco: Never  Vaping Use   Vaping Use: Never used  Substance and Sexual Activity   Alcohol use: Yes    Comment: rarely   Drug use: Never   Sexual activity: Yes    Partners: Male    Birth control/protection: Surgical    Comment: Tubal ligation  Other Topics Concern   Not on file  Social History Narrative   Not on file   Social Determinants of Health   Financial Resource Strain: Not  on file  Food Insecurity: Not on file  Transportation Needs: Not on file  Physical Activity: Not on file  Stress: Not on file  Social Connections: Not on file  Intimate Partner Violence: Not on file    Review of Systems: See HPI, otherwise negative ROS  Physical Exam: BP 121/86   Pulse 75   Temp (!) 97.1 F (36.2 C)   Resp 16   Ht '5\' 6"'  (1.676 m)   Wt 66.4 kg   SpO2 100%   BMI 23.61 kg/m  General:   Alert,  pleasant and cooperative in NAD Head:  Normocephalic and atraumatic. Neck:  Supple; no masses or thyromegaly. Lungs:  Clear throughout to auscultation.    Heart:  Regular rate and rhythm. Abdomen:  Soft, nontender and nondistended. Normal bowel sounds, without guarding, and without rebound.   Neurologic:  Alert and   oriented x4;  grossly normal neurologically.  Impression/Plan: Anita Cowan is here for an colonoscopy to be performed for colon cancer screening  Risks, benefits, limitations, and alternatives regarding  colonoscopy have been reviewed with the patient.  Questions have been answered.  All parties agreeable.   Anita Sear, MD  08/01/2021, 9:57 AM

## 2021-08-01 NOTE — Anesthesia Preprocedure Evaluation (Signed)
Anesthesia Evaluation  Patient identified by MRN, date of birth, ID band Patient awake    Reviewed: Allergy & Precautions, NPO status , Patient's Chart, lab work & pertinent test results  Airway Mallampati: III  TM Distance: <3 FB Neck ROM: full    Dental  (+) Chipped   Pulmonary neg pulmonary ROS, neg shortness of breath,    Pulmonary exam normal        Cardiovascular Exercise Tolerance: Good hypertension, (-) anginaNormal cardiovascular exam     Neuro/Psych negative neurological ROS  negative psych ROS   GI/Hepatic negative GI ROS, Neg liver ROS, neg GERD  ,  Endo/Other  negative endocrine ROS  Renal/GU negative Renal ROS  negative genitourinary   Musculoskeletal   Abdominal   Peds  Hematology negative hematology ROS (+)   Anesthesia Other Findings Past Medical History: 03/2019: BRCA negative     Comment:  IBIS=12.8%/riskscore=18.9% No date: Breast microcalcifications No date: Essential hypertension No date: Family history of breast cancer No date: History of kidney stones No date: Lower extremity edema No date: Ovarian cyst  Past Surgical History: 06/10/2020: BREAST BIOPSY; Left     Comment:  distortion, x marker, path pending No date: CHOLECYSTECTOMY No date: CHOLECYSTECTOMY, LAPAROSCOPIC 07/08/2020: DILATATION & CURETTAGE/HYSTEROSCOPY WITH MYOSURE     Comment:  Procedure: DILATATION & CURETTAGE/HYSTEROSCOPY WITH               MYOSURE POLYPECTOMY;  Surgeon: Staebler, Andreas, MD;                Location: [facility] ORS;  Service: Gynecology;; 08/03/2020: DILITATION & CURRETTAGE/HYSTROSCOPY WITH NOVASURE  ABLATION; N/A     Comment:  Procedure: HYSTEROSCOPY WITH NOVASURE ABLATION;                Surgeon: Staebler, Andreas, MD;  Location: [facility] ORS;                Service: Gynecology;  Laterality: N/A; 1989: KNEE ARTHROSCOPY; Right No date: TONSILLECTOMY No date: TUBAL LIGATION  BMI    Body Mass Index:  23.61 kg/m      Reproductive/Obstetrics negative OB ROS                             Anesthesia Physical Anesthesia Plan  ASA: 3  Anesthesia Plan: General   Post-op Pain Management:    Induction: Intravenous  PONV Risk Score and Plan: Propofol infusion and TIVA  Airway Management Planned: Natural Airway and Nasal Cannula  Additional Equipment:   Intra-op Plan:   Post-operative Plan:   Informed Consent: I have reviewed the patients History and Physical, chart, labs and discussed the procedure including the risks, benefits and alternatives for the proposed anesthesia with the patient or authorized representative who has indicated his/her understanding and acceptance.     Dental Advisory Given  Plan Discussed with: Anesthesiologist, CRNA and Surgeon  Anesthesia Plan Comments: (Patient consented for risks of anesthesia including but not limited to:  - adverse reactions to medications - risk of airway placement if required - damage to eyes, teeth, lips or other oral mucosa - nerve damage due to positioning  - sore throat or hoarseness - Damage to heart, brain, nerves, lungs, other parts of body or loss of life  Patient voiced understanding.)        Anesthesia Quick Evaluation  

## 2021-08-01 NOTE — Anesthesia Postprocedure Evaluation (Signed)
Anesthesia Post Note  Patient: Anysha Frappier  Procedure(s) Performed: COLONOSCOPY WITH PROPOFOL  Patient location during evaluation: Endoscopy Anesthesia Type: General Level of consciousness: awake and alert Pain management: pain level controlled Vital Signs Assessment: post-procedure vital signs reviewed and stable Respiratory status: spontaneous breathing, nonlabored ventilation, respiratory function stable and patient connected to nasal cannula oxygen Cardiovascular status: blood pressure returned to baseline and stable Postop Assessment: no apparent nausea or vomiting Anesthetic complications: no   No notable events documented.   Last Vitals:  Vitals:   08/01/21 1046 08/01/21 1056  BP: 115/73 127/89  Pulse: 63 67  Resp: 12 12  Temp:    SpO2: 100% 100%    Last Pain:  Vitals:   08/01/21 1056  TempSrc:   PainSc: 0-No pain                 Precious Haws 

## 2021-08-01 NOTE — Transfer of Care (Signed)
Immediate Anesthesia Transfer of Care Note  Patient: Anita Cowan  Procedure(s) Performed: COLONOSCOPY WITH PROPOFOL  Patient Location: PACU and Endoscopy Unit  Anesthesia Type:General  Level of Consciousness: awake  Airway & Oxygen Therapy: Patient Spontanous Breathing  Post-op Assessment: Report given to RN  Post vital signs: stable  Last Vitals:  Vitals Value Taken Time  BP 107/74 08/01/21 1036  Temp    Pulse 73 08/01/21 1037  Resp 15 08/01/21 1037  SpO2 99 % 08/01/21 1037  Vitals shown include unvalidated device data.  Last Pain:  Vitals:   08/01/21 0930  PainSc: 0-No pain         Complications: No notable events documented.

## 2021-08-01 NOTE — Op Note (Signed)
[facility] Gastroenterology Patient Name: Anita Cowan Procedure Date: 08/01/2021 10:09 AM MRN: [id number] Account #: [id number] Date of Birth: [birth year] Admit Type: Outpatient Age: 51 Room: [facility] ENDO ROOM 4 Gender: Female Note Status: Finalized Instrument Name: Jasper Riling [id number] Procedure:             Colonoscopy Indications:           Screening for colorectal malignant neoplasm, This is                         the patient's first colonoscopy Providers:             Lin Landsman MD, MD Referring MD:          Dionne Bucy. Bacigalupo (Referring MD) Medicines:             General Anesthesia Complications:         No immediate complications. Estimated blood loss: None. Procedure:             Pre-Anesthesia Assessment:                        - Prior to the procedure, a History and Physical was                         performed, and patient medications and allergies were                         reviewed. The patient is competent. The risks and                         benefits of the procedure and the sedation options and                         risks were discussed with the patient. All questions                         were answered and informed consent was obtained.                         Patient identification and proposed procedure were                         verified by the physician, the nurse, the                         anesthesiologist, the anesthetist and the technician                         in the pre-procedure area in the procedure room in the                         endoscopy suite. Mental Status Examination: alert and                         oriented. Airway Examination: normal oropharyngeal                         airway and neck mobility. Respiratory Examination:  clear to auscultation. CV Examination: normal.                         Prophylactic Antibiotics: The patient does not require                          prophylactic antibiotics. Prior Anticoagulants: The                         patient has taken no previous anticoagulant or                         antiplatelet agents. ASA Grade Assessment: III - A                         patient with severe systemic disease. After reviewing                         the risks and benefits, the patient was deemed in                         satisfactory condition to undergo the procedure. The                         anesthesia plan was to use general anesthesia.                         Immediately prior to administration of medications,                         the patient was re-assessed for adequacy to receive                         sedatives. The heart rate, respiratory rate, oxygen                         saturations, blood pressure, adequacy of pulmonary                         ventilation, and response to care were monitored                         throughout the procedure. The physical status of the                         patient was re-assessed after the procedure.                        After obtaining informed consent, the colonoscope was                         passed under direct vision. Throughout the procedure,                         the patient's blood pressure, pulse, and oxygen                         saturations were monitored continuously. The  Colonoscope was introduced through the anus and                         advanced to the the terminal ileum, with                         identification of the appendiceal orifice and IC                         valve. The colonoscopy was performed without                         difficulty. The patient tolerated the procedure well.                         The quality of the bowel preparation was evaluated                         using the BBPS [facility] Bowel Preparation Scale) with                         scores of: Right Colon = 3, Transverse Colon = 3 and                          Left Colon = 3 (entire mucosa seen well with no                         residual staining, small fragments of stool or opaque                         liquid). The total BBPS score equals 9. Findings:      The perianal and digital rectal examinations were normal. Pertinent       negatives include normal sphincter tone and no palpable rectal lesions.      The terminal ileum appeared normal.      A 10 mm polyp was found in the descending colon. The polyp was sessile.       The polyp was removed with a hot snare. Resection and retrieval were       complete. Estimated blood loss: none.      The retroflexed view of the distal rectum and anal verge was normal and       showed no anal or rectal abnormalities. Impression:            - The examined portion of the ileum was normal.                        - One 10 mm polyp in the descending colon, removed                         with a hot snare. Resected and retrieved.                        - The distal rectum and anal verge are normal on                         retroflexion view. Recommendation:        - Discharge  patient to home (with escort).                        - Resume previous diet today.                        - Continue present medications.                        - Await pathology results.                        - Repeat colonoscopy in 3 - 5 years for surveillance. Procedure Code(s):     --- Professional ---                        [phone], Colonoscopy, flexible; with removal of                         tumor(s), polyp(s), or other lesion(s) by snare                         technique Diagnosis Code(s):     --- Professional ---                        Z12.11, Encounter for screening for malignant neoplasm                         of colon                        K63.5, Polyp of colon CPT copyright 2019 American Medical Association. All rights reserved. The codes documented in this report are preliminary and upon coder review may  be  revised to meet current compliance requirements. Dr. Ulyess Mort Lin Landsman MD, MD 08/01/2021 10:32:36 AM This report has been signed electronically. Number of Addenda: 0 Note Initiated On: 08/01/2021 10:09 AM Scope Withdrawal Time: 0 hours 9 minutes 8 seconds  Total Procedure Duration: 0 hours 11 minutes 31 seconds  Estimated Blood Loss:  Estimated blood loss: none.      [facility]

## 2021-08-02 ENCOUNTER — Encounter: Payer: Self-pay | Admitting: Gastroenterology

## 2021-08-02 LAB — SURGICAL PATHOLOGY

## 2021-09-22 ENCOUNTER — Encounter: Payer: Self-pay | Admitting: Obstetrics and Gynecology

## 2021-10-05 ENCOUNTER — Telehealth: Payer: Self-pay

## 2021-10-05 NOTE — Telephone Encounter (Signed)
Patient called in reporting that her sister was diagnosed with leukemia just a few day ago. She reports she received a test kit HLA for see if she can donate bone marrow. Patient reports she was advised to contact PCP. Please advise.

## 2021-10-06 NOTE — Progress Notes (Unsigned)
    I,Sha'taria Tyson,acting as a Education administrator for [employer], PA-C.,have documented all relevant documentation on the behalf of [name], PA-C,as directed by  [name], PA-C while in the presence of [name], PA-C.  MyChart Video Visit    Virtual Visit via Video Note   This visit type was conducted due to national recommendations for restrictions regarding the COVID-19 Pandemic (e.g. social distancing) in an effort to limit this patient's exposure and mitigate transmission in our community. This patient is at least at moderate risk for complications without adequate follow up. This format is felt to be most appropriate for this patient at this time. Physical exam was limited by quality of the video and audio technology used for the visit.   Patient location: *** Provider location: [facility]  I discussed the limitations of evaluation and management by telemedicine and the availability of in person appointments. The patient expressed understanding and agreed to proceed.  Patient: Anita Cowan   DOB: 1970/07/19   51 y.o. Female  MRN: [id number] Visit Date: 10/07/2021  Today's healthcare provider: [name], PA-C   No chief complaint on file.  Subjective    HPI  Patient is being seen today to discuss being a bone marrow donor.    Medications: Outpatient Medications Prior to Visit  Medication Sig   hydrochlorothiazide (HYDRODIURIL) 25 MG tablet Take 1 tablet (25 mg total) by mouth in the morning.   No facility-administered medications prior to visit.    Review of Systems  {Labs  Heme  Chem  Endocrine  Serology  Results Review (optional):23779}   Objective    There were no vitals taken for this visit.  {Show previous vital signs (optional):23777}   Physical Exam     Assessment & Plan     ***  No follow-ups on file.     I discussed the assessment and treatment plan with the patient. The patient was provided an  opportunity to ask questions and all were answered. The patient agreed with the plan and demonstrated an understanding of the instructions.   The patient was advised to call back or seek an in-person evaluation if the symptoms worsen or if the condition fails to improve as anticipated.  I provided *** minutes of non-face-to-face time during this encounter.  {provider attestation***:1}  [name], PA-C [facility] [phone] (phone) [phone] (fax)  [place]

## 2021-10-06 NOTE — Telephone Encounter (Signed)
Please schedule for virtual visit to discuss any questions patient may have regarding this process.   Anita Foster, MD  [facility]  [phone]

## 2021-10-06 NOTE — Telephone Encounter (Signed)
Called patient to schedule a virtual visit to discuss. LMTCB. Okay for PEC to advise.

## 2021-10-06 NOTE — Telephone Encounter (Signed)
Virtual scheduled  °

## 2021-10-07 ENCOUNTER — Telehealth (INDEPENDENT_AMBULATORY_CARE_PROVIDER_SITE_OTHER): Payer: BC Managed Care – PPO | Admitting: Physician Assistant

## 2021-10-07 ENCOUNTER — Encounter: Payer: Self-pay | Admitting: Physician Assistant

## 2021-10-07 DIAGNOSIS — Z0189 Encounter for other specified special examinations: Secondary | ICD-10-CM

## 2021-10-11 ENCOUNTER — Encounter: Payer: Self-pay | Admitting: Physician Assistant

## 2021-10-11 ENCOUNTER — Ambulatory Visit (INDEPENDENT_AMBULATORY_CARE_PROVIDER_SITE_OTHER): Payer: BC Managed Care – PPO | Admitting: Physician Assistant

## 2021-10-11 ENCOUNTER — Encounter: Payer: BC Managed Care – PPO | Admitting: Family Medicine

## 2021-10-11 VITALS — BP 121/90 | HR 75 | Temp 97.7°F | Ht 66.0 in | Wt 150.6 lb

## 2021-10-11 DIAGNOSIS — Z0189 Encounter for other specified special examinations: Secondary | ICD-10-CM | POA: Diagnosis not present

## 2021-10-11 NOTE — Progress Notes (Signed)
I,Sha'taria Tyson,acting as a Education administrator for [employer], PA-C.,have documented all relevant documentation on the behalf of Anita Kirschner, PA-C,as directed by  Anita Kirschner, PA-C while in the presence of Anita Kirschner, PA-C.  Established patient visit   Patient: Anita Cowan   DOB: 02/24/1970   51 y.o. Female  MRN: [id number] Visit Date: 10/11/2021  Today's healthcare provider: [name], PA-C   Cc. Bone marrow donor test kit  Subjective    HPI  Pt presents today for blood draw for HLA match kit. Her sister was diagnosed with leukemia and the hospital in [place] sent a kit to see if she is a Orthoptist.   Medications: Outpatient Medications Prior to Visit  Medication Sig   hydrochlorothiazide (HYDRODIURIL) 25 MG tablet Take 1 tablet (25 mg total) by mouth in the morning.   No facility-administered medications prior to visit.     Objective    Blood pressure (!) 121/90, pulse 75, temperature 97.7 F (36.5 C), temperature source Oral, height $RemoveBefo'5\' 6"'bApOSzUBSAE$  (1.676 m), weight 150 lb 9.6 oz (68.3 kg), SpO2 100 %.     No results found for any visits on 10/11/21.  Assessment & Plan     Blood was drawn today for vials in kit. Advised pt that for appropriate chain of command-- to ship the kit herself. I packaged with all given information and samples w/ labels.   I, Anita Kirschner, PA-C have reviewed all documentation for this visit. The documentation on  10/11/2021  for the exam, diagnosis, procedures, and orders are all accurate and complete.Anita Kirschner, PA-C [facility] [address] [place], [place], [address] Office: [phone] Fax: [place]

## 2021-11-11 NOTE — Progress Notes (Unsigned)
I, S ,acting as a Education administrator for Lavon Paganini, MD.,have documented all relevant documentation on the behalf of Lavon Paganini, MD,as directed by  Lavon Paganini, MD while in the presence of Lavon Paganini, MD.    Complete physical exam   Patient: Anita Cowan   DOB: [birth year]   51 y.o. Female  MRN: [id number] Visit Date: 11/14/2021  Today's healthcare provider: Lavon Paganini, MD   Chief Complaint  Patient presents with   Annual Exam   Subjective    Anita Cowan is a 51 y.o. female who presents today for a complete physical exam.  She reports consuming a  gluten free, sugar free, grain free  diet.  walking  She generally feels well. She reports sleeping well. She does not have additional problems to discuss today.  HPI  Patient declined Tdap and shingles vaccine today.  Past Medical History:  Diagnosis Date   Abnormal uterine bleeding (AUB)    BRCA negative 03/2019   IBIS=12.8%/riskscore=18.9%   Breast microcalcifications    Essential hypertension    Family history of breast cancer    History of kidney stones    Lower extremity edema    Ovarian cyst    Past Surgical History:  Procedure Laterality Date   BREAST BIOPSY Left 06/10/2020   distortion, x marker, path pending   CHOLECYSTECTOMY     CHOLECYSTECTOMY, LAPAROSCOPIC     COLONOSCOPY WITH PROPOFOL N/A 08/01/2021   Procedure: COLONOSCOPY WITH PROPOFOL;  Surgeon: Lin Landsman, MD;  Location: [facility] ENDOSCOPY;  Service: Gastroenterology;  Laterality: N/A;   DILATATION & CURETTAGE/HYSTEROSCOPY WITH MYOSURE  07/08/2020   Procedure: DILATATION & CURETTAGE/HYSTEROSCOPY WITH MYOSURE POLYPECTOMY;  Surgeon: Malachy Mood, MD;  Location: [facility] ORS;  Service: Gynecology;;   [place] N/A 08/03/2020   Procedure: HYSTEROSCOPY WITH NOVASURE ABLATION;  Surgeon: Malachy Mood, MD;  Location: [facility] ORS;  Service: Gynecology;  Laterality: N/A;    KNEE ARTHROSCOPY Right 1989   TONSILLECTOMY     TUBAL LIGATION     Social History   Socioeconomic History   Marital status: Married    Spouse name: Not on file   Number of children: 3   Years of education: Not on file   Highest education level: Not on file  Occupational History   Occupation: Pharmacist, hospital  Tobacco Use   Smoking status: Never   Smokeless tobacco: Never  Vaping Use   Vaping Use: Never used  Substance and Sexual Activity   Alcohol use: Yes    Comment: rarely   Drug use: Never   Sexual activity: Yes    Partners: Male    Birth control/protection: Surgical    Comment: Tubal ligation  Other Topics Concern   Not on file  Social History Narrative   Not on file   Social Determinants of Health   Financial Resource Strain: Not on file  Food Insecurity: Not on file  Transportation Needs: Not on file  Physical Activity: Not on file  Stress: Not on file  Social Connections: Not on file  Intimate Partner Violence: Not on file   Family Status  Relation Name Status   Mother  Alive   Sister  Alive   MGM  Deceased   Mat Uncle  Deceased   Rhea  Deceased   Neg Hx  (Not Specified)   Family History  Problem Relation Age of Onset   Hypertension Mother    Lupus Mother  discoid   Cervical cancer Mother    Breast cancer Sister 68   Thyroid nodules Sister        thyroid tumor in her teens   Lung cancer Maternal Grandmother        smoker   Diabetes Maternal Uncle    Breast cancer Maternal Uncle        twice   Bladder Cancer Maternal Uncle    Lung cancer Maternal Uncle    Colon cancer Neg Hx    Allergies  Allergen Reactions   Codeine Hives   Tramadol Hives   Wheat Extract     Other reaction(s): GI Intolerance   Azithromycin Rash   Penicillins Rash    Reaction: 10 years ago   Sulfa Antibiotics Rash    Patient Care Team: Virginia Crews, MD as PCP - General (Family Medicine)   Medications: Outpatient Medications Prior  to Visit  Medication Sig   hydrochlorothiazide (HYDRODIURIL) 25 MG tablet Take 1 tablet (25 mg total) by mouth in the morning.   No facility-administered medications prior to visit.    Review of Systems  All other systems reviewed and are negative.   Last CBC Lab Results  Component Value Date   WBC 6.0 07/06/2020   HGB 14.3 07/06/2020   HCT 41.9 07/06/2020   MCV 91.1 07/06/2020   MCH 31.1 07/06/2020   RDW 12.4 07/06/2020   PLT 277 47/18/5501   Last metabolic panel Lab Results  Component Value Date   GLUCOSE 95 04/04/2021   NA 138 04/04/2021   K 3.5 04/04/2021   CL 97 04/04/2021   CO2 27 04/04/2021   BUN 13 04/04/2021   CREATININE 0.80 04/04/2021   EGFR 90 04/04/2021   CALCIUM 9.8 04/04/2021   PROT 7.4 04/04/2021   ALBUMIN 5.0 (H) 04/04/2021   LABGLOB 2.4 04/04/2021   AGRATIO 2.1 04/04/2021   BILITOT 0.4 04/04/2021   ALKPHOS 58 04/04/2021   AST 17 04/04/2021   ALT 12 04/04/2021   Last lipids Lab Results  Component Value Date   CHOL 225 (H) 04/04/2021   HDL 67 04/04/2021   LDLCALC 135 (H) 04/04/2021   TRIG 131 04/04/2021   CHOLHDL 3.4 04/04/2021   Last vitamin D Lab Results  Component Value Date   VD25OH 48.8 03/17/2019   Last vitamin B12 and Folate Lab Results  Component Value Date   VITAMINB12 1,917 (H) 03/17/2019      Objective    BP 116/85 (BP Location: Left Arm, Patient Position: Sitting, Cuff Size: Large)   Pulse 80   Temp 97.8 F (36.6 C) (Oral)   Resp 16   Ht '5\' 6"'  (1.676 m)   Wt 150 lb 1.6 oz (68.1 kg)   BMI 24.23 kg/m  BP Readings from Last 3 Encounters:  11/14/21 116/85  10/11/21 (!) 121/90  08/01/21 127/89   Wt Readings from Last 3 Encounters:  11/14/21 150 lb 1.6 oz (68.1 kg)  10/11/21 150 lb 9.6 oz (68.3 kg)  08/01/21 146 lb 4.8 oz (66.4 kg)       Physical Exam Vitals reviewed.  Constitutional:      General: She is not in acute distress.    Appearance: Normal appearance. She is well-developed. She is not  diaphoretic.  HENT:     Head: Normocephalic and atraumatic.     Right Ear: Tympanic membrane, ear canal and external ear normal.     Left Ear: Tympanic membrane, ear canal and external ear normal.  Nose: Nose normal.     Mouth/Throat:     Mouth: Mucous membranes are moist.     Pharynx: Oropharynx is clear. No oropharyngeal exudate.  Eyes:     General: No scleral icterus.    Conjunctiva/sclera: Conjunctivae normal.     Pupils: Pupils are equal, round, and reactive to light.  Neck:     Thyroid: No thyromegaly.  Cardiovascular:     Rate and Rhythm: Normal rate and regular rhythm.     Pulses: Normal pulses.     Heart sounds: Normal heart sounds. No murmur heard. Pulmonary:     Effort: Pulmonary effort is normal. No respiratory distress.     Breath sounds: Normal breath sounds. No wheezing or rales.  Abdominal:     General: There is no distension.     Palpations: Abdomen is soft.     Tenderness: There is no abdominal tenderness.  Musculoskeletal:        General: No deformity.     Cervical back: Neck supple.     Right lower leg: No edema.     Left lower leg: No edema.  Lymphadenopathy:     Cervical: No cervical adenopathy.  Skin:    General: Skin is warm and dry.     Findings: No rash.  Neurological:     Mental Status: She is alert and oriented to person, place, and time. Mental status is at baseline.     Gait: Gait normal.  Psychiatric:        Mood and Affect: Mood normal.        Behavior: Behavior normal.        Thought Content: Thought content normal.       Last depression screening scores    11/14/2021    8:54 AM 10/11/2021    8:41 AM 04/04/2021   10:49 AM  PHQ 2/9 Scores  PHQ - 2 Score 0 0 0  PHQ- 9 Score 0 0 0   Last fall risk screening    11/14/2021    8:54 AM  Fall Risk   Falls in the past year? 0  Number falls in past yr: 0  Injury with Fall? 0  Risk for fall due to : No Fall Risks  Follow up Falls evaluation completed   Last Audit-C alcohol use  screening    11/14/2021    8:55 AM  Alcohol Use Disorder Test (AUDIT)  1. How often do you have a drink containing alcohol? 3  2. How many drinks containing alcohol do you have on a typical day when you are drinking? 0  3. How often do you have six or more drinks on one occasion? 0  AUDIT-C Score 3   A score of 3 or more in women, and 4 or more in men indicates increased risk for alcohol abuse, EXCEPT if all of the points are from question 1   No results found for any visits on 11/14/21.  Assessment & Plan    Routine Health Maintenance and Physical Exam  Exercise Activities and Dietary recommendations  Goals   None     Immunization History  Administered Date(s) Administered   Janssen (J&J) SARS-COV-2 Vaccination 05/23/2019    Health Maintenance  Topic Date Due   TETANUS/TDAP  Never done   COVID-19 Vaccine (2 - Booster for [employer] series) 07/18/2019   Zoster Vaccines- Shingrix (1 of 2) Never done   INFLUENZA VACCINE  04/23/2022 (Originally 08/23/2021)   MAMMOGRAM  06/17/2023   PAP SMEAR-Modifier  03/18/2024  COLONOSCOPY (Pts 45-69yr Insurance coverage will need to be confirmed)  08/01/2024   Hepatitis C Screening  Completed   HIV Screening  Completed   HPV VACCINES  Aged Out    Discussed health benefits of physical activity, and encouraged her to engage in regular exercise appropriate for her age and condition.  Problem List Items Addressed This Visit       Cardiovascular and Mediastinum   Essential hypertension    Well controlled Continue current medications Recheck metabolic panel F/u in 6 months      Relevant Orders   Comprehensive metabolic panel   Lipid Panel With LDL/HDL Ratio     Other   Moderate mixed hyperlipidemia not requiring statin therapy    Reviewed last lipid panel Not currently on a statin Recheck FLP and CMP Discussed diet and exercise       Other Visit Diagnoses     Annual physical exam    -  Primary   Relevant Orders   Lipid  Panel With LDL/HDL Ratio   Avitaminosis D       Relevant Orders   VITAMIN D 25 Hydroxy (Vit-D Deficiency, Fractures)   B12 deficiency       Relevant Orders   Vitamin B12        Return in about 6 months (around 05/16/2022) for chronic disease f/u.     I, ALavon Paganini MD, have reviewed all documentation for this visit. The documentation on 11/14/21 for the exam, diagnosis, procedures, and orders are all accurate and complete.   Bacigalupo, ADionne Bucy MD, MPH [facility]

## 2021-11-14 ENCOUNTER — Ambulatory Visit (INDEPENDENT_AMBULATORY_CARE_PROVIDER_SITE_OTHER): Payer: BC Managed Care – PPO | Admitting: Family Medicine

## 2021-11-14 ENCOUNTER — Encounter: Payer: Self-pay | Admitting: Family Medicine

## 2021-11-14 VITALS — BP 116/85 | HR 80 | Temp 97.8°F | Resp 16 | Ht 66.0 in | Wt 150.1 lb

## 2021-11-14 DIAGNOSIS — I10 Essential (primary) hypertension: Secondary | ICD-10-CM | POA: Diagnosis not present

## 2021-11-14 DIAGNOSIS — E538 Deficiency of other specified B group vitamins: Secondary | ICD-10-CM

## 2021-11-14 DIAGNOSIS — E559 Vitamin D deficiency, unspecified: Secondary | ICD-10-CM | POA: Diagnosis not present

## 2021-11-14 DIAGNOSIS — Z0000 Encounter for general adult medical examination without abnormal findings: Secondary | ICD-10-CM

## 2021-11-14 DIAGNOSIS — E782 Mixed hyperlipidemia: Secondary | ICD-10-CM

## 2021-11-14 NOTE — Assessment & Plan Note (Signed)
Well controlled Continue current medications Recheck metabolic panel F/u in 6 months  

## 2021-11-14 NOTE — Assessment & Plan Note (Signed)
Reviewed last lipid panel Not currently on a statin Recheck FLP and CMP Discussed diet and exercise  

## 2021-11-15 LAB — COMPREHENSIVE METABOLIC PANEL
ALT: 30 IU/L (ref 0–32)
AST: 20 IU/L (ref 0–40)
Albumin/Globulin Ratio: 2.3 — ABNORMAL HIGH (ref 1.2–2.2)
Albumin: 4.9 g/dL (ref 3.9–4.9)
Alkaline Phosphatase: 51 IU/L (ref 44–121)
BUN/Creatinine Ratio: 18 (ref 9–23)
BUN: 16 mg/dL (ref 6–24)
Bilirubin Total: 0.7 mg/dL (ref 0.0–1.2)
CO2: 19 mmol/L — ABNORMAL LOW (ref 20–29)
Calcium: 9.9 mg/dL (ref 8.7–10.2)
Chloride: 101 mmol/L (ref 96–106)
Creatinine, Ser: 0.88 mg/dL (ref 0.57–1.00)
Globulin, Total: 2.1 g/dL (ref 1.5–4.5)
Glucose: 103 mg/dL — ABNORMAL HIGH (ref 70–99)
Potassium: 3.7 mmol/L (ref 3.5–5.2)
Sodium: 142 mmol/L (ref 134–144)
Total Protein: 7 g/dL (ref 6.0–8.5)
eGFR: 80 mL/min/{1.73_m2} (ref >59)

## 2021-11-15 LAB — VITAMIN B12: Vitamin B-12: 968 pg/mL (ref 232–1245)

## 2021-11-15 LAB — LIPID PANEL WITH LDL/HDL RATIO
Cholesterol, Total: 235 mg/dL — ABNORMAL HIGH (ref 100–199)
HDL: 76 mg/dL (ref >39)
LDL Chol Calc (NIH): 138 mg/dL — ABNORMAL HIGH (ref 0–99)
LDL/HDL Ratio: 1.8 ratio (ref 0.0–3.2)
Triglycerides: 121 mg/dL (ref 0–149)
VLDL Cholesterol Cal: 21 mg/dL (ref 5–40)

## 2021-11-15 LAB — VITAMIN D 25 HYDROXY (VIT D DEFICIENCY, FRACTURES): Vit D, 25-Hydroxy: 37.6 ng/mL (ref 30.0–100.0)

## 2021-11-18 ENCOUNTER — Other Ambulatory Visit: Payer: Self-pay | Admitting: Family Medicine

## 2021-11-18 DIAGNOSIS — I10 Essential (primary) hypertension: Secondary | ICD-10-CM

## 2021-11-18 NOTE — Telephone Encounter (Signed)
Requested Prescriptions  Pending Prescriptions Disp Refills  . hydrochlorothiazide (HYDRODIURIL) 25 MG tablet [Pharmacy Med Name: HYDROCHLOROTHIAZIDE 25 MG TAB] 90 tablet 0    Sig: TAKE ONE TABLET EVERY MORNING     Cardiovascular: Diuretics - Thiazide Passed - 11/18/2021  2:14 PM      Passed - Cr in normal range and within 180 days    Creatinine, Ser  Date Value Ref Range Status  11/14/2021 0.88 0.57 - 1.00 mg/dL Final         Passed - K in normal range and within 180 days    Potassium  Date Value Ref Range Status  11/14/2021 3.7 3.5 - 5.2 mmol/L Final         Passed - Na in normal range and within 180 days    Sodium  Date Value Ref Range Status  11/14/2021 142 134 - 144 mmol/L Final         Passed - Last BP in normal range    BP Readings from Last 1 Encounters:  11/14/21 116/85         Passed - Valid encounter within last 6 months    Recent Outpatient Visits          4 days ago Annual physical exam   [facility], Dionne Bucy, MD   1 month ago Encounter for blood test   [facility] [name], PA-C   1 month ago Encounters for blood and urine testing   [facility] Thedore Mins, [place], PA-C   7 months ago Essential hypertension   [facility], Dionne Bucy, MD   1 year ago Annual physical exam   [facility], Dionne Bucy, MD

## 2021-12-10 ENCOUNTER — Other Ambulatory Visit: Payer: Self-pay | Admitting: Family Medicine

## 2021-12-10 DIAGNOSIS — I10 Essential (primary) hypertension: Secondary | ICD-10-CM

## 2022-05-09 ENCOUNTER — Encounter: Payer: Self-pay | Admitting: Family Medicine

## 2022-05-25 ENCOUNTER — Ambulatory Visit: Payer: BC Managed Care – PPO | Admitting: Family Medicine

## 2022-06-02 ENCOUNTER — Ambulatory Visit: Payer: BC Managed Care – PPO | Admitting: Family Medicine

## 2022-06-02 ENCOUNTER — Encounter: Payer: Self-pay | Admitting: Family Medicine

## 2022-06-02 VITALS — BP 115/79 | HR 86 | Temp 98.0°F | Resp 12 | Ht 65.5 in | Wt 142.2 lb

## 2022-06-02 DIAGNOSIS — I10 Essential (primary) hypertension: Secondary | ICD-10-CM | POA: Diagnosis not present

## 2022-06-02 DIAGNOSIS — R739 Hyperglycemia, unspecified: Secondary | ICD-10-CM

## 2022-06-02 MED ORDER — LOSARTAN POTASSIUM 50 MG PO TABS: 50.0000 mg | ORAL_TABLET | Freq: Every day | ORAL | 1 refills | Status: DC

## 2022-06-02 NOTE — Progress Notes (Signed)
I,Sulibeya S Dimas,acting as a Neurosurgeon for Shirlee Latch, MD.,have documented all relevant documentation on the behalf of Shirlee Latch, MD,as directed by  Shirlee Latch, MD while in the presence of Shirlee Latch, MD.     Established patient visit   Patient: Anita Cowan   DOB: [birth year]   52 y.o. Female  MRN: [id number] Visit Date: 06/02/2022  Today's healthcare provider: Shirlee Latch, MD   Chief Complaint  Patient presents with   Hypertension   Subjective    HPI  Hypertension, follow-up  BP Readings from Last 3 Encounters:  06/02/22 115/79  11/14/21 116/85  10/11/21 (!) 121/90   Wt Readings from Last 3 Encounters:  06/02/22 142 lb 3.2 oz (64.5 kg)  11/14/21 150 lb 1.6 oz (68.1 kg)  10/11/21 150 lb 9.6 oz (68.3 kg)     She was last seen for hypertension 6 months ago.  BP at that visit was 116/85. Management since that visit includes no changes.  She reports  changing diet. And is requesting to change medication to lisinopril, as swelling is not an issue anymore.   She is having side effects. BP not controlled, eyes get dry, legs tingling and dizziness.  She is following a  non-inflammatory  diet. She reports BP reading are lower in the morning and usually takes half a tablet of the HCTZ.  Outside blood pressures are 140/95, 138/99, 102/79. Patient reports bp reading are all over the place.   Pertinent labs Lab Results  Component Value Date   CHOL 235 (H) 11/14/2021   HDL 76 11/14/2021   LDLCALC 138 (H) 11/14/2021   TRIG 121 11/14/2021   CHOLHDL 3.4 04/04/2021   Lab Results  Component Value Date   NA 142 11/14/2021   K 3.7 11/14/2021   CREATININE 0.88 11/14/2021   EGFR 80 11/14/2021   GLUCOSE 103 (H) 11/14/2021     The 10-year ASCVD risk score (Arnett DK, et al., 2019) is: 1.1%  ---------------------------------------------------------------------------------------------------   Medications: Outpatient Medications Prior to  Visit  Medication Sig   [DISCONTINUED] hydrochlorothiazide (HYDRODIURIL) 25 MG tablet TAKE ONE TABLET EVERY MORNING   No facility-administered medications prior to visit.    Review of Systems  Constitutional:  Negative for appetite change and fatigue.  Eyes:        Dry eye  Respiratory:  Negative for cough, chest tightness and shortness of breath.   Cardiovascular:  Positive for leg swelling. Negative for chest pain and palpitations.  Gastrointestinal:  Negative for abdominal pain, nausea and vomiting.  Neurological:  Positive for dizziness, light-headedness and headaches. Negative for tremors, speech difficulty, weakness and numbness.       Objective    BP 115/79 (BP Location: Left Arm, Patient Position: Sitting, Cuff Size: Large)   Pulse 86   Temp 98 F (36.7 C) (Temporal)   Resp 12   Ht 5' 5.5" (1.664 m)   Wt 142 lb 3.2 oz (64.5 kg)   SpO2 98%   BMI 23.30 kg/m    Physical Exam Vitals reviewed.  Constitutional:      General: She is not in acute distress.    Appearance: Normal appearance. She is well-developed. She is not diaphoretic.  HENT:     Head: Normocephalic and atraumatic.  Eyes:     General: No scleral icterus.    Conjunctiva/sclera: Conjunctivae normal.  Neck:     Thyroid: No thyromegaly.  Cardiovascular:     Rate and Rhythm: Normal rate and regular rhythm.  Pulses: Normal pulses.     Heart sounds: Normal heart sounds. No murmur heard. Pulmonary:     Effort: Pulmonary effort is normal. No respiratory distress.     Breath sounds: Normal breath sounds. No wheezing, rhonchi or rales.  Musculoskeletal:     Cervical back: Neck supple.     Right lower leg: No edema.     Left lower leg: No edema.  Lymphadenopathy:     Cervical: No cervical adenopathy.  Skin:    General: Skin is warm and dry.     Findings: No rash.  Neurological:     Mental Status: She is alert and oriented to person, place, and time. Mental status is at baseline.  Psychiatric:         Mood and Affect: Mood normal.        Behavior: Behavior normal.       No results found for any visits on 06/02/22.  Assessment & Plan     Problem List Items Addressed This Visit       Cardiovascular and Mediastinum   Essential hypertension - Primary    Well controlled, but having SEs from HCTZ - feels dried out all the time D/c hctz and start losartan 50 mg daily instead Discussed potential side effects Recheck metabolic panel after starting losartan F/u in 6 months       Relevant Medications   losartan (COZAAR) 50 MG tablet   Other Relevant Orders   Basic Metabolic Panel (BMET)   Other Visit Diagnoses     Hyperglycemia       Relevant Orders   Hemoglobin A1c        Return in about 6 months (around 12/03/2022) for CPE.      I, Shirlee Latch, MD, have reviewed all documentation for this visit. The documentation on 06/02/22 for the exam, diagnosis, procedures, and orders are all accurate and complete.   , Marzella Schlein, MD, MPH [facility]

## 2022-06-02 NOTE — Assessment & Plan Note (Signed)
Well controlled, but having SEs from HCTZ - feels dried out all the time D/c hctz and start losartan 50 mg daily instead Discussed potential side effects Recheck metabolic panel after starting losartan F/u in 6 months

## 2022-06-06 LAB — HEMOGLOBIN A1C
Est. average glucose Bld gHb Est-mCnc: 100 mg/dL
Hgb A1c MFr Bld: 5.1 % (ref 4.8–5.6)

## 2022-06-06 LAB — BASIC METABOLIC PANEL
BUN/Creatinine Ratio: 16 (ref 9–23)
BUN: 13 mg/dL (ref 6–24)
CO2: 24 mmol/L (ref 20–29)
Calcium: 9.2 mg/dL (ref 8.7–10.2)
Chloride: 99 mmol/L (ref 96–106)
Creatinine, Ser: 0.82 mg/dL (ref 0.57–1.00)
Glucose: 90 mg/dL (ref 70–99)
Potassium: 3.9 mmol/L (ref 3.5–5.2)
Sodium: 140 mmol/L (ref 134–144)
eGFR: 87 mL/min/{1.73_m2} (ref >59)

## 2022-07-28 ENCOUNTER — Other Ambulatory Visit: Payer: Self-pay | Admitting: Obstetrics and Gynecology

## 2022-07-28 DIAGNOSIS — Z1231 Encounter for screening mammogram for malignant neoplasm of breast: Secondary | ICD-10-CM

## 2022-08-08 ENCOUNTER — Ambulatory Visit
Admission: RE | Admit: 2022-08-08 | Discharge: 2022-08-08 | Disposition: A | Discharge status: Home / Self Care | Payer: BC Managed Care – PPO | Source: Ambulatory Visit | Attending: Obstetrics and Gynecology | Admitting: Obstetrics and Gynecology

## 2022-08-08 DIAGNOSIS — Z1231 Encounter for screening mammogram for malignant neoplasm of breast: Secondary | ICD-10-CM | POA: Diagnosis present

## 2022-08-09 ENCOUNTER — Other Ambulatory Visit: Payer: Self-pay | Admitting: Obstetrics and Gynecology

## 2022-08-09 DIAGNOSIS — R928 Other abnormal and inconclusive findings on diagnostic imaging of breast: Secondary | ICD-10-CM

## 2022-08-15 ENCOUNTER — Other Ambulatory Visit: Payer: Self-pay | Admitting: Obstetrics and Gynecology

## 2022-08-15 ENCOUNTER — Ambulatory Visit: Admission: RE | Admit: 2022-08-15 | Payer: BC Managed Care – PPO | Source: Ambulatory Visit

## 2022-08-15 ENCOUNTER — Ambulatory Visit
Admission: RE | Admit: 2022-08-15 | Discharge: 2022-08-15 | Disposition: A | Discharge status: Home / Self Care | Payer: BC Managed Care – PPO | Source: Ambulatory Visit | Attending: Obstetrics and Gynecology | Admitting: Obstetrics and Gynecology

## 2022-08-15 DIAGNOSIS — R928 Other abnormal and inconclusive findings on diagnostic imaging of breast: Secondary | ICD-10-CM | POA: Diagnosis present

## 2022-08-15 HISTORY — PX: BREAST BIOPSY: SHX20

## 2022-08-15 MED ORDER — CHLOROPROCAINE HCL 2 % IJ SOLN
20.0000 mL | Freq: Once | INTRAMUSCULAR | Status: AC
  Administered 2022-08-15: 20 mL
  Filled 2022-08-15: qty 30

## 2022-08-15 MED ORDER — LIDOCAINE-EPINEPHRINE 1 %-1:100000 IJ SOLN
20.0000 mL | Freq: Once | INTRAMUSCULAR | Status: AC
  Administered 2022-08-15: 20 mL
  Filled 2022-08-15: qty 20

## 2022-08-15 MED ORDER — LIDOCAINE 1 % OPTIME INJ - NO CHARGE
5.0000 mL | Freq: Once | INTRAMUSCULAR | Status: AC
  Administered 2022-08-15: 5 mL
  Filled 2022-08-15: qty 6

## 2022-08-16 ENCOUNTER — Encounter: Payer: Self-pay | Admitting: *Deleted

## 2022-08-16 NOTE — Progress Notes (Signed)
Referral recieved from [facility] for benign breast mass.  Appt. Scheduled with Dr. Maia Plan for 09/05/22 at 1:45 per patient preference.

## 2022-09-06 ENCOUNTER — Other Ambulatory Visit: Payer: Self-pay | Admitting: General Surgery

## 2022-09-06 DIAGNOSIS — N6489 Other specified disorders of breast: Secondary | ICD-10-CM

## 2022-09-07 ENCOUNTER — Ambulatory Visit: Payer: Self-pay | Admitting: General Surgery

## 2022-09-07 NOTE — H&P (View-Only) (Signed)
 PATIENT PROFILE: Anita Cowan is a 53 y.o. female who presents to the Clinic for consultation at the request of Dr. Patsy Lager for evaluation of complex grossing lesion.   PCP:  Erasmo Downer, MD   HISTORY OF PRESENT ILLNESS: Anita Cowan reports she had her usual screening mammogram on 08/08/2022.  She was found with a small distortion of the right breast.  Diagnostic mammogram confirmed a persistent distortion of the right breast.  Core biopsy was done showing complex sclerosing lesion.  No atypia.   Family history of breast cancer: Patient has a sister with history of breast cancer at age of 64.  As per patient she was tested for BRCA and was negative. 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   GENERAL REVIEW OF SYSTEMS:    General ROS: negative for - chills, fatigue, fever, weight gain or weight loss Allergy and Immunology ROS: negative for - hives  Hematological and Lymphatic ROS: negative for - bleeding problems or bruising, negative for palpable nodes Endocrine ROS: negative for - heat or cold intolerance, hair changes Respiratory ROS: negative for - cough, shortness of breath or wheezing Cardiovascular ROS: no chest pain or palpitations GI ROS: negative for nausea, vomiting, abdominal pain, diarrhea, constipation Musculoskeletal ROS: negative for - joint swelling or muscle pain Neurological ROS: negative for - confusion, syncope Dermatological ROS: negative for pruritus and rash Psychiatric: negative for anxiety, depression, difficulty sleeping and memory loss   MEDICATIONS: Current Medications        Current Outpatient Medications  Medication Sig Dispense Refill   hydroCHLOROthiazide (HYDRODIURIL) 25 MG tablet Take 25 mg by mouth as needed       losartan (COZAAR) 50 MG tablet Take 50 mg by mouth once daily        No current  facility-administered medications for this visit.        ALLERGIES: Codeine, Sulfa (sulfonamide antibiotics), Tramadol, Wheat, Wheat containing prod, Azithromycin, and Penicillins   PAST MEDICAL HISTORY: Past Medical History      Past Medical History:  Diagnosis Date   Breast cancer (CMS/HHS-HCC) 08/2022    right   HTN (hypertension)          PAST SURGICAL HISTORY: Past Surgical History       Past Surgical History:  Procedure Laterality Date   ARTHROSCOPY KNEE Right 1989   LAPAROSCOPIC TUBAL LIGATION   2001   LASER ABLATION CONDYLOMA CERVICAL / VULVAR   2022   TONSILLECTOMY & ADENOIDECTOMY        around 1979        FAMILY HISTORY: Family History        Family History  Problem Relation Name Age of Onset   Cervical cancer Mother       Breast cancer Sister            DCIS   Acute myelogenous leukemia Sister       Breast cancer Maternal Uncle       Lung cancer Maternal Grandmother            SOCIAL HISTORY: Social History  Social History         Socioeconomic History   Marital status: Married  Tobacco Use   Smoking status: Never      Passive exposure: Never   Smokeless tobacco: Never  Vaping Use   Vaping status: Never Used  Substance  and Sexual Activity   Alcohol use: Yes      Comment: occasionally   Drug use: Never    Social Determinants of Health        Financial Resource Strain: Low Risk  (05/22/2022)    Received from [facility]    Overall Financial Resource Strain (CARDIA)     Difficulty of Paying Living Expenses: Not hard at all  Food Insecurity: No Food Insecurity (05/22/2022)    Received from [facility]    Hunger Vital Sign     Worried About Running Out of Food in the Last Year: Never true     Ran Out of Food in the Last Year: Never true  Transportation Needs: No Transportation Needs (05/22/2022)    Received from [facility]     Lack of Transportation (Medical): No     Lack of Transportation (Non-Medical): No         PHYSICAL EXAM:    Vitals:    09/05/22 1356  BP: 120/85  Pulse: 81    Body mass index is 23.76 kg/m. Weight: 65.8 kg (145 lb)    GENERAL: Alert, active, oriented x3   HEENT: Pupils equal reactive to light. Extraocular movements are intact. Sclera clear. Palpebral conjunctiva normal red color.Pharynx clear.   NECK: Supple with no palpable mass and no adenopathy.   LUNGS: Sound clear with no rales rhonchi or wheezes.   HEART: Regular rhythm S1 and S2 without murmur.   BREAST: breasts appear normal, no suspicious masses, no skin or nipple changes or axillary nodes.   ABDOMEN: Soft and depressible, nontender with no palpable mass, no hepatomegaly.   EXTREMITIES: Well-developed well-nourished symmetrical with no dependent edema.   NEUROLOGICAL: Awake alert oriented, facial expression symmetrical, moving all extremities.   REVIEW OF DATA: I have reviewed the following data today: No results found for any previous visit.      ASSESSMENT: Anita Cowan is a 52 y.o. female presenting for consultation for complex sclerosing lesion of the right breast.     Patient was oriented again about the pathology results. Surgical alternatives were discussed with patient including excisional biopsy surgical technique and post operative care was discussed with patient. Risk of surgery was discussed with patient including but not limited to: wound infection, seroma, hematoma, brachial plexopathy, mondor's disease (thrombosis of small veins of breast), chronic wound pain, breast lymphedema, altered sensation to the nipple and cosmesis among others.    Patient understand the purpose of the surgery is diagnostic, not reported.   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  Right breast radiofrequency tagged excisional biopsy (19125) 2.  Avoid taking aspirin or blood thinners 5 days before the surgery 3.  Contact us if you have any concern   Patient verbalized  understanding, all questions were answered, and were agreeable with the plan outlined above.        Carolan Shiver, MD   Electronically signed by Carolan Shiver, MD

## 2022-09-07 NOTE — H&P (Signed)
PATIENT PROFILE: Anita Cowan is a 53 y.o. female who presents to the Clinic for consultation at the request of Dr. Patsy Lager for evaluation of complex grossing lesion.   PCP:  Erasmo Downer, MD   HISTORY OF PRESENT ILLNESS: Anita Cowan reports she had her usual screening mammogram on 08/08/2022.  She was found with a small distortion of the right breast.  Diagnostic mammogram confirmed a persistent distortion of the right breast.  Core biopsy was done showing complex sclerosing lesion.  No atypia.   Family history of breast cancer: Patient has a sister with history of breast cancer at age of 64.  As per patient she was tested for BRCA and was negative. 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   GENERAL REVIEW OF SYSTEMS:    General ROS: negative for - chills, fatigue, fever, weight gain or weight loss Allergy and Immunology ROS: negative for - hives  Hematological and Lymphatic ROS: negative for - bleeding problems or bruising, negative for palpable nodes Endocrine ROS: negative for - heat or cold intolerance, hair changes Respiratory ROS: negative for - cough, shortness of breath or wheezing Cardiovascular ROS: no chest pain or palpitations GI ROS: negative for nausea, vomiting, abdominal pain, diarrhea, constipation Musculoskeletal ROS: negative for - joint swelling or muscle pain Neurological ROS: negative for - confusion, syncope Dermatological ROS: negative for pruritus and rash Psychiatric: negative for anxiety, depression, difficulty sleeping and memory loss   MEDICATIONS: Current Medications        Current Outpatient Medications  Medication Sig Dispense Refill   hydroCHLOROthiazide (HYDRODIURIL) 25 MG tablet Take 25 mg by mouth as needed       losartan (COZAAR) 50 MG tablet Take 50 mg by mouth once daily        No current  facility-administered medications for this visit.        ALLERGIES: Codeine, Sulfa (sulfonamide antibiotics), Tramadol, Wheat, Wheat containing prod, Azithromycin, and Penicillins   PAST MEDICAL HISTORY: Past Medical History      Past Medical History:  Diagnosis Date   Breast cancer (CMS/HHS-HCC) 08/2022    right   HTN (hypertension)          PAST SURGICAL HISTORY: Past Surgical History       Past Surgical History:  Procedure Laterality Date   ARTHROSCOPY KNEE Right 1989   LAPAROSCOPIC TUBAL LIGATION   2001   LASER ABLATION CONDYLOMA CERVICAL / VULVAR   2022   TONSILLECTOMY & ADENOIDECTOMY        around 1979        FAMILY HISTORY: Family History        Family History  Problem Relation Name Age of Onset   Cervical cancer Mother       Breast cancer Sister            DCIS   Acute myelogenous leukemia Sister       Breast cancer Maternal Uncle       Lung cancer Maternal Grandmother            SOCIAL HISTORY: Social History  Social History         Socioeconomic History   Marital status: Married  Tobacco Use   Smoking status: Never      Passive exposure: Never   Smokeless tobacco: Never  Vaping Use   Vaping status: Never Used  Substance  and Sexual Activity   Alcohol use: Yes      Comment: occasionally   Drug use: Never    Social Determinants of Health        Financial Resource Strain: Low Risk  (05/22/2022)    Received from [facility]    Overall Financial Resource Strain (CARDIA)     Difficulty of Paying Living Expenses: Not hard at all  Food Insecurity: No Food Insecurity (05/22/2022)    Received from [facility]    Hunger Vital Sign     Worried About Running Out of Food in the Last Year: Never true     Ran Out of Food in the Last Year: Never true  Transportation Needs: No Transportation Needs (05/22/2022)    Received from [facility] - [facility]     Lack of Transportation (Medical): No     Lack of Transportation (Non-Medical): No         PHYSICAL EXAM:    Vitals:    09/05/22 1356  BP: 120/85  Pulse: 81    Body mass index is 23.76 kg/m. Weight: 65.8 kg (145 lb)    GENERAL: Alert, active, oriented x3   HEENT: Pupils equal reactive to light. Extraocular movements are intact. Sclera clear. Palpebral conjunctiva normal red color.Pharynx clear.   NECK: Supple with no palpable mass and no adenopathy.   LUNGS: Sound clear with no rales rhonchi or wheezes.   HEART: Regular rhythm S1 and S2 without murmur.   BREAST: breasts appear normal, no suspicious masses, no skin or nipple changes or axillary nodes.   ABDOMEN: Soft and depressible, nontender with no palpable mass, no hepatomegaly.   EXTREMITIES: Well-developed well-nourished symmetrical with no dependent edema.   NEUROLOGICAL: Awake alert oriented, facial expression symmetrical, moving all extremities.   REVIEW OF DATA: I have reviewed the following data today: No results found for any previous visit.      ASSESSMENT: [name] is a 52 y.o. female presenting for consultation for complex sclerosing lesion of the right breast.     Patient was oriented again about the pathology results. Surgical alternatives were discussed with patient including excisional biopsy surgical technique and post operative care was discussed with patient. Risk of surgery was discussed with patient including but not limited to: wound infection, seroma, hematoma, brachial plexopathy, mondor's disease (thrombosis of small veins of breast), chronic wound pain, breast lymphedema, altered sensation to the nipple and cosmesis among others.    Patient understand the purpose of the surgery is diagnostic, not reported.   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  Right breast radiofrequency tagged excisional biopsy (19125) 2.  Avoid taking aspirin or blood thinners 5 days before the surgery 3.  Contact us if you have any concern   Patient verbalized  understanding, all questions were answered, and were agreeable with the plan outlined above.        Carolan Shiver, MD   Electronically signed by Carolan Shiver, MD

## 2022-09-08 ENCOUNTER — Other Ambulatory Visit: Payer: Self-pay

## 2022-09-08 ENCOUNTER — Encounter
Admission: RE | Admit: 2022-09-08 | Discharge: 2022-09-08 | Disposition: A | Discharge status: Home / Self Care | Payer: BC Managed Care – PPO | Source: Ambulatory Visit | Attending: General Surgery | Admitting: General Surgery

## 2022-09-08 VITALS — Ht 65.0 in | Wt 142.0 lb

## 2022-09-08 DIAGNOSIS — I10 Essential (primary) hypertension: Secondary | ICD-10-CM

## 2022-09-08 DIAGNOSIS — Z01812 Encounter for preprocedural laboratory examination: Secondary | ICD-10-CM

## 2022-09-08 HISTORY — DX: Benign neoplasm of descending colon: D12.4

## 2022-09-08 HISTORY — DX: Mixed hyperlipidemia: E78.2

## 2022-09-08 HISTORY — DX: Polyp of corpus uteri: N84.0

## 2022-09-08 NOTE — Patient Instructions (Addendum)
Your procedure is scheduled on: Wednesday August 21 Report to the Registration Desk on the 1st floor of the [facility] To find out your arrival time, please call [phone] between 1PM - 3PM on:  Tuesday August 20  If your arrival time is 6:00 am, do not arrive before that time as the [facility] entrance doors do not open until 6:00 am.  REMEMBER: Instructions that are not followed completely may result in serious medical risk, up to and including death; or upon the discretion of your surgeon and anesthesiologist your surgery may need to be rescheduled.  Do not eat food after midnight the night before surgery.  No gum chewing or hard candies.  One week prior to surgery: Stop Anti-inflammatories (NSAIDS) such as Advil, Aleve, Ibuprofen, Motrin, Naproxen, Naprosyn and Aspirin based products such as Excedrin, Goody's Powder, BC Powder.  You may however, continue to take Tylenol if needed for pain up until the day of surgery.  TAKE ONLY THESE MEDICATIONS THE MORNING OF SURGERY WITH A SIP OF WATER:  losartan (COZAAR)   No Alcohol for 24 hours before or after surgery.  No Smoking including e-cigarettes for 24 hours before surgery.  No chewable tobacco products for at least 6 hours before surgery.  No nicotine patches on the day of surgery.  Do not use any "recreational" drugs for at least a week (preferably 2 weeks) before your surgery.  Please be advised that the combination of cocaine and anesthesia may have negative outcomes, up to and including death. If you test positive for cocaine, your surgery will be cancelled.  On the morning of surgery brush your teeth with toothpaste and water, you may rinse your mouth with mouthwash if you wish. Do not swallow any toothpaste or mouthwash.  Use CHG Soap as directed on instruction sheet.  Do not wear jewelry, make-up, hairpins, clips or nail polish.  Do not wear lotions, powders, or perfumes.   Do not shave body hair from the neck  down 48 hours before surgery.  Contact lenses, hearing aids and dentures may not be worn into surgery.  Do not bring valuables to the hospital. [facility] is not responsible for any missing/lost belongings or valuables.   Notify your doctor if there is any change in your medical condition (cold, fever, infection).  Wear comfortable clothing (specific to your surgery type) to the hospital.  After surgery, you can help prevent lung complications by doing breathing exercises.  Take deep breaths and cough every 1-2 hours.  If you are being discharged the day of surgery, you will not be allowed to drive home. You will need a responsible individual to drive you home and stay with you for 24 hours after surgery.   If you are taking public transportation, you will need to have a responsible individual with you.  Please call the Pre-admissions Testing Dept. at [phone] if you have any questions about these instructions.  Surgery Visitation Policy:  Patients having surgery or a procedure may have two visitors.  Children under the age of 11 must have an adult with them who is not the patient.       Preparing for Surgery with CHLORHEXIDINE GLUCONATE (CHG) Soap  Chlorhexidine Gluconate (CHG) Soap  o An antiseptic cleaner that kills germs and bonds with the skin to continue killing germs even after washing  o Used for showering the night before surgery and morning of surgery  Before surgery, you can play an important role by reducing the  number of germs on your skin.  CHG (Chlorhexidine gluconate) soap is an antiseptic cleanser which kills germs and bonds with the skin to continue killing germs even after washing.  Please do not use if you have an allergy to CHG or antibacterial soaps. If your skin becomes reddened/irritated stop using the CHG.  1. Shower the NIGHT BEFORE SURGERY and the MORNING OF SURGERY with CHG soap.  2. If you choose to wash your hair, wash your hair first  as usual with your normal shampoo.  3. After shampooing, rinse your hair and body thoroughly to remove the shampoo.  4. Use CHG as you would any other liquid soap. You can apply CHG directly to the skin and wash gently with a scrungie or a clean washcloth.  5. Apply the CHG soap to your body only from the neck down. Do not use on open wounds or open sores. Avoid contact with your eyes, ears, mouth, and genitals (private parts). Wash face and genitals (private parts) with your normal soap.  6. Wash thoroughly, paying special attention to the area where your surgery will be performed.  7. Thoroughly rinse your body with warm water.  8. Do not shower/wash with your normal soap after using and rinsing off the CHG soap.  9. Pat yourself dry with a clean towel.  10. Wear clean pajamas to bed the night before surgery.  12. Place clean sheets on your bed the night of your first shower and do not sleep with pets.  13. Shower again with the CHG soap on the day of surgery prior to arriving at the hospital.  14. Do not apply any deodorants/lotions/powders.  15. Please wear clean clothes to the hospital.

## 2022-09-11 ENCOUNTER — Ambulatory Visit
Admission: RE | Admit: 2022-09-11 | Discharge: 2022-09-11 | Disposition: A | Discharge status: Home / Self Care | Payer: BC Managed Care – PPO | Source: Ambulatory Visit | Attending: General Surgery | Admitting: General Surgery

## 2022-09-11 ENCOUNTER — Encounter
Admission: RE | Admit: 2022-09-11 | Discharge: 2022-09-11 | Disposition: A | Discharge status: Home / Self Care | Payer: BC Managed Care – PPO | Source: Ambulatory Visit | Attending: General Surgery | Admitting: General Surgery

## 2022-09-11 DIAGNOSIS — N6489 Other specified disorders of breast: Secondary | ICD-10-CM | POA: Insufficient documentation

## 2022-09-11 DIAGNOSIS — I10 Essential (primary) hypertension: Secondary | ICD-10-CM | POA: Insufficient documentation

## 2022-09-11 DIAGNOSIS — Z01812 Encounter for preprocedural laboratory examination: Secondary | ICD-10-CM | POA: Insufficient documentation

## 2022-09-11 DIAGNOSIS — Z803 Family history of malignant neoplasm of breast: Secondary | ICD-10-CM | POA: Diagnosis not present

## 2022-09-11 DIAGNOSIS — N6021 Fibroadenosis of right breast: Secondary | ICD-10-CM | POA: Diagnosis not present

## 2022-09-11 DIAGNOSIS — N6081 Other benign mammary dysplasias of right breast: Secondary | ICD-10-CM | POA: Diagnosis not present

## 2022-09-11 HISTORY — PX: BREAST BIOPSY: SHX20

## 2022-09-11 LAB — BASIC METABOLIC PANEL
Anion gap: 7 (ref 5–15)
BUN: 14 mg/dL (ref 6–20)
CO2: 26 mmol/L (ref 22–32)
Calcium: 8.8 mg/dL — ABNORMAL LOW (ref 8.9–10.3)
Chloride: 103 mmol/L (ref 98–111)
Creatinine, Ser: 0.7 mg/dL (ref 0.44–1.00)
GFR, Estimated: >60 mL/min (ref >60)
Glucose, Bld: 137 mg/dL — ABNORMAL HIGH (ref 70–99)
Potassium: 3.6 mmol/L (ref 3.5–5.1)
Sodium: 136 mmol/L (ref 135–145)

## 2022-09-11 LAB — CBC
HCT: 40.3 % (ref 36.0–46.0)
Hemoglobin: 14.4 g/dL (ref 12.0–15.0)
MCH: 31.6 pg (ref 26.0–34.0)
MCHC: 35.7 g/dL (ref 30.0–36.0)
MCV: 88.6 fL (ref 80.0–100.0)
Platelets: 283 10*3/uL (ref 150–400)
RBC: 4.55 MIL/uL (ref 3.87–5.11)
RDW: 12.3 % (ref 11.5–15.5)
WBC: 6.1 10*3/uL (ref 4.0–10.5)
nRBC: 0 % (ref 0.0–0.2)

## 2022-09-11 MED ORDER — LIDOCAINE HCL 1 % IJ SOLN
10.0000 mL | Freq: Once | INTRAMUSCULAR | Status: DC
  Filled 2022-09-11: qty 10

## 2022-09-11 MED ORDER — CHLOROPROCAINE HCL 1 % IJ SOLN
10.0000 mL | Freq: Once | INTRAMUSCULAR | Status: DC
  Filled 2022-09-11: qty 30

## 2022-09-11 MED ORDER — CHLOROPROCAINE HCL (PF) 2 % IJ SOLN
10.0000 mL | Freq: Once | INTRAMUSCULAR | Status: AC
  Administered 2022-09-11: 10 mL
  Filled 2022-09-11: qty 10

## 2022-09-13 ENCOUNTER — Ambulatory Visit: Payer: BC Managed Care – PPO | Admitting: Certified Registered"

## 2022-09-13 ENCOUNTER — Encounter
Admission: RE | Disposition: A | Discharge status: Home / Self Care | Payer: Self-pay | Source: Home / Self Care | Attending: General Surgery

## 2022-09-13 ENCOUNTER — Ambulatory Visit: Payer: BC Managed Care – PPO | Admitting: Urgent Care

## 2022-09-13 ENCOUNTER — Other Ambulatory Visit: Payer: Self-pay

## 2022-09-13 ENCOUNTER — Ambulatory Visit
Admission: RE | Admit: 2022-09-13 | Discharge: 2022-09-13 | Disposition: A | Discharge status: Home / Self Care | Payer: BC Managed Care – PPO | Attending: General Surgery | Admitting: General Surgery

## 2022-09-13 ENCOUNTER — Ambulatory Visit
Admission: RE | Admit: 2022-09-13 | Discharge: 2022-09-13 | Disposition: A | Discharge status: Home / Self Care | Payer: BC Managed Care – PPO | Source: Ambulatory Visit | Attending: General Surgery | Admitting: General Surgery

## 2022-09-13 ENCOUNTER — Encounter: Payer: Self-pay | Admitting: General Surgery

## 2022-09-13 DIAGNOSIS — Z803 Family history of malignant neoplasm of breast: Secondary | ICD-10-CM | POA: Insufficient documentation

## 2022-09-13 DIAGNOSIS — I10 Essential (primary) hypertension: Secondary | ICD-10-CM

## 2022-09-13 DIAGNOSIS — N6489 Other specified disorders of breast: Secondary | ICD-10-CM

## 2022-09-13 DIAGNOSIS — N6021 Fibroadenosis of right breast: Secondary | ICD-10-CM | POA: Insufficient documentation

## 2022-09-13 DIAGNOSIS — N6081 Other benign mammary dysplasias of right breast: Secondary | ICD-10-CM | POA: Insufficient documentation

## 2022-09-13 DIAGNOSIS — Z01812 Encounter for preprocedural laboratory examination: Secondary | ICD-10-CM

## 2022-09-13 HISTORY — PX: BREAST EXCISIONAL BIOPSY: SUR124

## 2022-09-13 HISTORY — PX: BREAST BIOPSY WITH RADIO FREQUENCY LOCALIZER: SHX6895

## 2022-09-13 LAB — POCT PREGNANCY, URINE: Preg Test, Ur: NEGATIVE

## 2022-09-13 SURGERY — BREAST BIOPSY WITH RADIO FREQUENCY LOCALIZER
Anesthesia: General | Site: Breast | Laterality: Right

## 2022-09-13 MED ORDER — MIDAZOLAM HCL 2 MG/2ML IJ SOLN
INTRAMUSCULAR | Status: DC | PRN
  Administered 2022-09-13: 2 mg via INTRAVENOUS

## 2022-09-13 MED ORDER — FENTANYL CITRATE (PF) 100 MCG/2ML IJ SOLN
INTRAMUSCULAR | Status: AC
  Filled 2022-09-13: qty 2

## 2022-09-13 MED ORDER — BUPIVACAINE-EPINEPHRINE (PF) 0.5% -1:200000 IJ SOLN
INTRAMUSCULAR | Status: DC | PRN
  Administered 2022-09-13: 30 mL

## 2022-09-13 MED ORDER — EPINEPHRINE PF 1 MG/ML IJ SOLN
INTRAMUSCULAR | Status: AC
  Filled 2022-09-13: qty 1

## 2022-09-13 MED ORDER — PROPOFOL 1000 MG/100ML IV EMUL
INTRAVENOUS | Status: AC
  Filled 2022-09-13: qty 100

## 2022-09-13 MED ORDER — FENTANYL CITRATE (PF) 100 MCG/2ML IJ SOLN
INTRAMUSCULAR | Status: DC | PRN
  Administered 2022-09-13: 50 ug via INTRAVENOUS
  Administered 2022-09-13 (×2): 25 ug via INTRAVENOUS

## 2022-09-13 MED ORDER — FENTANYL CITRATE (PF) 100 MCG/2ML IJ SOLN: 25.0000 ug | INTRAMUSCULAR | Status: DC | PRN

## 2022-09-13 MED ORDER — LIDOCAINE HCL (CARDIAC) PF 100 MG/5ML IV SOSY
PREFILLED_SYRINGE | INTRAVENOUS | Status: DC | PRN
  Administered 2022-09-13: 60 mg via INTRAVENOUS

## 2022-09-13 MED ORDER — OXYCODONE HCL 5 MG PO TABS
5.0000 mg | ORAL_TABLET | Freq: Once | ORAL | Status: AC | PRN
  Administered 2022-09-13: 5 mg via ORAL

## 2022-09-13 MED ORDER — PROPOFOL 10 MG/ML IV BOLUS
INTRAVENOUS | Status: DC | PRN
  Administered 2022-09-13: 150 mg via INTRAVENOUS

## 2022-09-13 MED ORDER — OXYCODONE HCL 5 MG PO TABS
ORAL_TABLET | ORAL | Status: AC
  Filled 2022-09-13: qty 1

## 2022-09-13 MED ORDER — OXYCODONE HCL 5 MG/5ML PO SOLN: 5.0000 mg | Freq: Once | ORAL | Status: AC | PRN

## 2022-09-13 MED ORDER — CHLORHEXIDINE GLUCONATE 0.12 % MT SOLN
15.0000 mL | Freq: Once | OROMUCOSAL | Status: AC
  Administered 2022-09-13: 15 mL via OROMUCOSAL

## 2022-09-13 MED ORDER — LACTATED RINGERS IV SOLN: INTRAVENOUS | Status: DC

## 2022-09-13 MED ORDER — CEFAZOLIN SODIUM-DEXTROSE 2-4 GM/100ML-% IV SOLN
2.0000 g | INTRAVENOUS | Status: AC
  Administered 2022-09-13: 2 g via INTRAVENOUS

## 2022-09-13 MED ORDER — ONDANSETRON HCL 4 MG/2ML IJ SOLN
INTRAMUSCULAR | Status: DC | PRN
Start: 2022-09-13 — End: 2022-09-13
  Administered 2022-09-13: 4 mg via INTRAVENOUS

## 2022-09-13 MED ORDER — CHLORHEXIDINE GLUCONATE 0.12 % MT SOLN
OROMUCOSAL | Status: AC
  Filled 2022-09-13: qty 15

## 2022-09-13 MED ORDER — DEXAMETHASONE SODIUM PHOSPHATE 10 MG/ML IJ SOLN
INTRAMUSCULAR | Status: DC | PRN
  Administered 2022-09-13: 5 mg via INTRAVENOUS

## 2022-09-13 MED ORDER — EPHEDRINE SULFATE (PRESSORS) 50 MG/ML IJ SOLN
INTRAMUSCULAR | Status: DC | PRN
  Administered 2022-09-13 (×4): 5 mg via INTRAVENOUS

## 2022-09-13 MED ORDER — PHENYLEPHRINE HCL (PRESSORS) 10 MG/ML IV SOLN
INTRAVENOUS | Status: DC | PRN
  Administered 2022-09-13 (×4): 40 ug via INTRAVENOUS

## 2022-09-13 MED ORDER — BUPIVACAINE HCL (PF) 0.5 % IJ SOLN
INTRAMUSCULAR | Status: AC
  Filled 2022-09-13: qty 30

## 2022-09-13 MED ORDER — CEFAZOLIN SODIUM-DEXTROSE 2-4 GM/100ML-% IV SOLN
INTRAVENOUS | Status: AC
  Filled 2022-09-13: qty 100

## 2022-09-13 MED ORDER — HYDROCODONE-ACETAMINOPHEN 5-325 MG PO TABS: 1.0000 | ORAL_TABLET | ORAL | 0 refills | Status: AC | PRN

## 2022-09-13 MED ORDER — SEVOFLURANE IN SOLN
RESPIRATORY_TRACT | Status: AC
  Filled 2022-09-13: qty 250

## 2022-09-13 MED ORDER — STERILE WATER FOR IRRIGATION IR SOLN
Status: DC | PRN
  Administered 2022-09-13: 200 mL

## 2022-09-13 MED ORDER — PROPOFOL 10 MG/ML IV BOLUS
INTRAVENOUS | Status: AC
  Filled 2022-09-13: qty 40

## 2022-09-13 MED ORDER — MIDAZOLAM HCL 2 MG/2ML IJ SOLN
INTRAMUSCULAR | Status: AC
  Filled 2022-09-13: qty 2

## 2022-09-13 MED ORDER — ORAL CARE MOUTH RINSE: 15.0000 mL | Freq: Once | OROMUCOSAL | Status: AC

## 2022-09-13 SURGICAL SUPPLY — 41 items
ADH SKN CLS APL DERMABOND .7 (GAUZE/BANDAGES/DRESSINGS) ×1
APL PRP STRL LF DISP 70% ISPRP (MISCELLANEOUS) ×1
BLADE SURG 15 STRL LF DISP TIS (BLADE) ×1 IMPLANT
BLADE SURG 15 STRL SS (BLADE) ×1
CHLORAPREP W/TINT 26 (MISCELLANEOUS) ×1 IMPLANT
CNTNR URN SCR LID CUP LEK RST (MISCELLANEOUS) ×1 IMPLANT
CONT SPEC 4OZ STRL OR WHT (MISCELLANEOUS)
DERMABOND ADVANCED .7 DNX12 (GAUZE/BANDAGES/DRESSINGS) ×1 IMPLANT
DEVICE DUBIN SPECIMEN MAMMOGRA (MISCELLANEOUS) ×1 IMPLANT
DRAPE LAPAROTOMY TRNSV 106X77 (MISCELLANEOUS) ×1 IMPLANT
ELECT CAUTERY BLADE TIP 2.5 (TIP) ×1
ELECT REM PT RETURN 9FT ADLT (ELECTROSURGICAL) ×1
ELECTRODE CAUTERY BLDE TIP 2.5 (TIP) ×1 IMPLANT
ELECTRODE REM PT RTRN 9FT ADLT (ELECTROSURGICAL) ×1 IMPLANT
GLOVE BIO SURGEON STRL SZ 6.5 (GLOVE) ×1 IMPLANT
GLOVE BIOGEL PI IND STRL 6.5 (GLOVE) ×1 IMPLANT
GOWN STRL REUS W/ TWL LRG LVL3 (GOWN DISPOSABLE) ×3 IMPLANT
GOWN STRL REUS W/TWL LRG LVL3 (GOWN DISPOSABLE) ×2
KIT MARKER MARGIN INK (KITS) IMPLANT
KIT TURNOVER KIT A (KITS) ×1 IMPLANT
LABEL OR SOLS (LABEL) ×1 IMPLANT
MANIFOLD NEPTUNE II (INSTRUMENTS) ×1 IMPLANT
MARKER MARGIN CORRECT CLIP (MARKER) IMPLANT
NDL HYPO 25X1 1.5 SAFETY (NEEDLE) ×1 IMPLANT
NEEDLE HYPO 25X1 1.5 SAFETY (NEEDLE) ×1
PACK BASIN MINOR ARMC (MISCELLANEOUS) ×1 IMPLANT
RETRACTOR RING XSMALL (MISCELLANEOUS) IMPLANT
RTRCTR WOUND ALEXIS 13CM XS SH (MISCELLANEOUS)
SHEATH BREAST BIOPSY SKIN MKR (SHEATH) ×1 IMPLANT
SUT MNCRL 4-0 (SUTURE) ×1
SUT MNCRL 4-0 27XMFL (SUTURE) ×1
SUT SILK 2 0 SH (SUTURE) ×1 IMPLANT
SUT VIC AB 3-0 SH 27 (SUTURE) ×1
SUT VIC AB 3-0 SH 27X BRD (SUTURE) ×1 IMPLANT
SUTURE MNCRL 4-0 27XMF (SUTURE) ×1 IMPLANT
SYR 10ML LL (SYRINGE) ×1 IMPLANT
SYR BULB IRRIG 60ML STRL (SYRINGE) ×1 IMPLANT
TRAP FLUID SMOKE EVACUATOR (MISCELLANEOUS) ×1 IMPLANT
TRAP NEPTUNE SPECIMEN COLLECT (MISCELLANEOUS) ×1 IMPLANT
WATER STERILE IRR 1000ML POUR (IV SOLUTION) ×1 IMPLANT
WATER STERILE IRR 500ML POUR (IV SOLUTION) ×1 IMPLANT

## 2022-09-13 NOTE — Anesthesia Procedure Notes (Signed)
Procedure Name: LMA Insertion Date/Time: 09/13/2022 7:42 AM  Performed by: Genia Del, CRNAPre-anesthesia Checklist: Patient identified, Patient being monitored, Timeout performed, Emergency Drugs available and Suction available Patient Re-evaluated:Patient Re-evaluated prior to induction Oxygen Delivery Method: Circle system utilized Preoxygenation: Pre-oxygenation with 100% oxygen Induction Type: IV induction Ventilation: Mask ventilation without difficulty LMA: LMA inserted LMA Size: 4.0 Tube type: Oral Number of attempts: 1 Placement Confirmation: positive ETCO2 and breath sounds checked- equal and bilateral Tube secured with: Tape Dental Injury: Teeth and Oropharynx as per pre-operative assessment  Comments: Igel # 4 LMA easily seated with good seal.

## 2022-09-13 NOTE — Transfer of Care (Signed)
Immediate Anesthesia Transfer of Care Note  Patient: Anita Cowan  Procedure(s) Performed: BREAST BIOPSY WITH RADIO FREQUENCY LOCALIZER (Right: Breast)  Patient Location: PACU  Anesthesia Type:General  Level of Consciousness: awake and sedated  Airway & Oxygen Therapy: Patient Spontanous Breathing and Patient connected to face mask oxygen  Post-op Assessment: Report given to RN and Post -op Vital signs reviewed and stable  Post vital signs: Reviewed and stable  Last Vitals:  Vitals Value Taken Time  BP 131/78   Temp 36.1 C 09/13/22 0849  Pulse 74   Resp 19   SpO2 100     Last Pain:  Vitals:   09/13/22 0628  TempSrc: Oral         Complications: No notable events documented.

## 2022-09-13 NOTE — Op Note (Signed)
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  Postoperative diagnosis: Same.   Procedure: Right radiofrequency tag-localized excisional biopsy.                      Anesthesia: GETA  Surgeon: Dr. Hazle Quant  Wound Classification: Clean  Indications: Patient is a 52 y.o. female with a nonpalpable right breast mass noted on mammography with core biopsy demonstrating complex sclerosing lesion requires radiofrequency tag-localized excisional biopsy to rule out malignancy.   Findings: 1. Specimen mammography shows marker and tag on specimen 2. No other palpable mass or lymph node identified.   Description of procedure: Preoperative radiofrequency tag localization was performed by radiology. The patient was taken to the operating room and placed supine on the operating table, and after general anesthesia the right chest was prepped and draped in the usual sterile fashion. A time-out was completed verifying correct patient, procedure, site, positioning, and implant(s) and/or special equipment prior to beginning this procedure.  By comparing the localization studies and interrogation with Localizer device, the probable trajectory and location of the mass was visualized. A circumareolar skin incision was planned in such a way as to minimize the amount of dissection to reach the mass.  The skin incision was made. Flaps were raised and the location of the tag was confirmed with Localizer device confirmed. A 2-0 silk figure-of-eight stay suture was placed and used for retraction. Dissection was then taken down circumferentially, taking care to include the entire localizing tag and a wide margin of grossly normal tissue. The specimen and entire localizing tag were removed. The specimen was oriented and sent to radiology with the localization studies. Confirmation was received that the entire target lesion had been resected. The wound was irrigated. Hemostasis was checked. The wound was  closed with interrupted sutures of 3-0 Vicryl and a subcuticular suture of Monocryl 3-0. No attempt was made to close the dead space.   Specimen: Right excisional biopsy                     Complications: None  Estimated Blood Loss: 5 mL

## 2022-09-13 NOTE — Anesthesia Preprocedure Evaluation (Signed)
Anesthesia Evaluation  Patient identified by MRN, date of birth, ID band Patient awake    Reviewed: Allergy & Precautions, NPO status , Patient's Chart, lab work & pertinent test results  History of Anesthesia Complications Negative for: history of anesthetic complications  Airway Mallampati: I  TM Distance: >3 FB Neck ROM: full    Dental no notable dental hx.    Pulmonary neg pulmonary ROS   Pulmonary exam normal        Cardiovascular hypertension, On Medications negative cardio ROS Normal cardiovascular exam     Neuro/Psych negative neurological ROS  negative psych ROS   GI/Hepatic negative GI ROS, Neg liver ROS,,,  Endo/Other  negative endocrine ROS    Renal/GU Renal disease (hx of stones)  negative genitourinary   Musculoskeletal   Abdominal   Peds  Hematology negative hematology ROS (+)   Anesthesia Other Findings Past Medical History: No date: Abnormal uterine bleeding (AUB) No date: Adenomatous polyp of descending colon 03/2019: BRCA negative     Comment:  IBIS=12.8%/riskscore=18.9% No date: Breast microcalcifications No date: Endometrial polyp No date: Essential hypertension No date: Family history of breast cancer No date: History of kidney stones No date: Lower extremity edema No date: Moderate mixed hyperlipidemia not requiring statin therapy No date: Ovarian cyst  Past Surgical History: 06/10/2020: BREAST BIOPSY; Left     Comment:  distortion, x marker, path pending 08/15/2022: BREAST BIOPSY; Right     Comment:  right breast stereo bx , coil clip - path pending 08/15/2022: BREAST BIOPSY; Right     Comment:  MM RT BREAST BX W LOC DEV 1ST LESION IMAGE BX SPEC               STEREO GUIDE 08/15/2022 [facility] 09/11/2022: BREAST BIOPSY; Right     Comment:  MM RT RADIO FREQUENCY TAG LOC MAMMO GUIDE 09/11/2022               [facility] No date: CHOLECYSTECTOMY No date: CHOLECYSTECTOMY,  LAPAROSCOPIC 08/01/2021: COLONOSCOPY WITH PROPOFOL; N/A     Comment:  Procedure: COLONOSCOPY WITH PROPOFOL;  Surgeon: Toney Reil, MD;  Location: [facility];  Service:               Gastroenterology;  Laterality: N/A; 07/08/2020: DILATATION & CURETTAGE/HYSTEROSCOPY WITH MYOSURE     Comment:  Procedure: DILATATION & CURETTAGE/HYSTEROSCOPY WITH               MYOSURE POLYPECTOMY;  Surgeon: Vena Austria, MD;                Location: [facility];  Service: Gynecology;; No date: DILATION AND CURETTAGE OF UTERUS 08/03/2020: DILITATION & CURRETTAGE/HYSTROSCOPY WITH NOVASURE  ABLATION; N/A     Comment:  Procedure: HYSTEROSCOPY WITH NOVASURE ABLATION;                Surgeon: Vena Austria, MD;  Location: [facility];                Service: Gynecology;  Laterality: N/A; 1989: KNEE ARTHROSCOPY; Right No date: TONSILLECTOMY No date: TUBAL LIGATION  BMI    Body Mass Index: 23.63 kg/m      Reproductive/Obstetrics negative OB ROS                             Anesthesia Physical Anesthesia Plan  ASA: 2  Anesthesia Plan: General LMA  Post-op Pain Management: Toradol IV (intra-op)* and Ofirmev IV (intra-op)*   Induction: Intravenous  PONV Risk Score and Plan: 3 and Dexamethasone, Ondansetron, Midazolam and Treatment may vary due to age or medical condition  Airway Management Planned: LMA  Additional Equipment:   Intra-op Plan:   Post-operative Plan: Extubation in OR  Informed Consent: I have reviewed the patients History and Physical, chart, labs and discussed the procedure including the risks, benefits and alternatives for the proposed anesthesia with the patient or authorized representative who has indicated his/her understanding and acceptance.     Dental Advisory Given  Plan Discussed with: Anesthesiologist, CRNA and Surgeon  Anesthesia Plan Comments: (Patient consented for risks of anesthesia including but not limited to:   - adverse reactions to medications - damage to eyes, teeth, lips or other oral mucosa - nerve damage due to positioning  - sore throat or hoarseness - Damage to heart, brain, nerves, lungs, other parts of body or loss of life  Patient voiced understanding.)       Anesthesia Quick Evaluation

## 2022-09-13 NOTE — Discharge Instructions (Addendum)
  Diet: Resume home heart healthy regular diet.   Activity: Increase activity as tolerated. Light activity and walking are encouraged. Do not drive or drink alcohol if taking narcotic pain medications.  Wound care: May shower with soapy water and pat dry (do not rub incisions), but no baths or submerging incision underwater until follow-up. (no swimming)   Medications: Resume all home medications. For mild to moderate pain: acetaminophen (Tylenol) or ibuprofen (if no kidney disease). Combining Tylenol with alcohol can substantially increase your risk of causing liver disease. Narcotic pain medications, if prescribed, can be used for severe pain, though may cause nausea, constipation, and drowsiness. Do not combine Tylenol and Norco within a 6 hour period as Norco contains Tylenol. If you do not need the narcotic pain medication, you do not need to fill the prescription.  Call office ([phone]) at any time if any questions, worsening pain, fevers/chills, bleeding, drainage from incision site, or other concerns. AMBULATORY SURGERY  DISCHARGE INSTRUCTIONS   The drugs that you were given will stay in your system until tomorrow so for the next 24 hours you should not:  Drive an automobile Make any legal decisions Drink any alcoholic beverage   You may resume regular meals tomorrow.  Today it is better to start with liquids and gradually work up to solid foods.  You may eat anything you prefer, but it is better to start with liquids, then soup and crackers, and gradually work up to solid foods.   Please notify your doctor immediately if you have any unusual bleeding, trouble breathing, redness and pain at the surgery site, drainage, fever, or pain not relieved by medication.    Additional Instructions:        Please contact your physician with any problems or Same Day Surgery at [phone], Monday through Friday 6 am to 4 pm, or [facility] Main number at  [phone].  

## 2022-09-13 NOTE — Interval H&P Note (Signed)
History and Physical Interval Note:  09/13/2022 7:18 AM  Anita Cowan  has presented today for surgery, with the diagnosis of N64.89 complex sclerosing lesion of rt breast.  The various methods of treatment have been discussed with the patient and family. After consideration of risks, benefits and other options for treatment, the patient has consented to  Procedure(s): BREAST BIOPSY WITH RADIO FREQUENCY LOCALIZER (Right) as a surgical intervention.  The patient's history has been reviewed, patient examined, no change in status, stable for surgery.  I have reviewed the patient's chart and labs.  Questions were answered to the patient's satisfaction.     Carolan Shiver

## 2022-09-13 NOTE — Anesthesia Postprocedure Evaluation (Signed)
Anesthesia Post Note  Patient: Anita Cowan  Procedure(s) Performed: BREAST BIOPSY WITH RADIO FREQUENCY LOCALIZER (Right: Breast)  Patient location during evaluation: PACU Anesthesia Type: General Level of consciousness: awake and alert Pain management: pain level controlled Vital Signs Assessment: post-procedure vital signs reviewed and stable Respiratory status: spontaneous breathing, nonlabored ventilation, respiratory function stable and patient connected to nasal cannula oxygen Cardiovascular status: blood pressure returned to baseline and stable Postop Assessment: no apparent nausea or vomiting Anesthetic complications: no   No notable events documented.   Last Vitals:  Vitals:   09/13/22 0900 09/13/22 0915  BP: 136/73 132/88  Pulse: 74 78  Resp: (!) 8 16  Temp:    SpO2: 100% 99%    Last Pain:  Vitals:   09/13/22 0915  TempSrc:   PainSc: 0-No pain                 Louie Boston

## 2022-10-31 ENCOUNTER — Other Ambulatory Visit: Payer: Self-pay | Admitting: Family Medicine

## 2022-11-01 MED ORDER — LOSARTAN POTASSIUM 50 MG PO TABS: 50.0000 mg | ORAL_TABLET | Freq: Every day | ORAL | 0 refills | Status: DC

## 2022-11-01 NOTE — Addendum Note (Signed)
Addended by: Elby Beck F on: 11/01/2022 09:08 AM   Modules accepted: Orders

## 2022-11-01 NOTE — Telephone Encounter (Signed)
Unable to change Rx- marked as print. Sent to office for correction.

## 2022-11-01 NOTE — Telephone Encounter (Signed)
Call to patient- she is aware her insurance may not pay for replacement Rx- she request #20- she will be able to get back to pick up remainder.  Requested Prescriptions  Pending Prescriptions Disp Refills   losartan (COZAAR) 50 MG tablet [Pharmacy Med Name: LOSARTAN POTASSIUM 50 MG TAB] 90 tablet 1    Sig: TAKE 1 TABLET BY MOUTH ONCE DAILY     Cardiovascular:  Angiotensin Receptor Blockers Failed - 10/31/2022  5:43 PM      Failed - Last BP in normal range    BP Readings from Last 1 Encounters:  09/13/22 (!) 140/82         Passed - Cr in normal range and within 180 days    Creatinine, Ser  Date Value Ref Range Status  09/11/2022 0.70 0.44 - 1.00 mg/dL Final         Passed - K in normal range and within 180 days    Potassium  Date Value Ref Range Status  09/11/2022 3.6 3.5 - 5.1 mmol/L Final         Passed - Patient is not pregnant      Passed - Valid encounter within last 6 months    Recent Outpatient Visits           5 months ago Essential hypertension   [facility] [place], Marzella Schlein, MD   11 months ago Annual physical exam   [facility] [place], Marzella Schlein, MD   1 year ago Encounter for blood test   [facility] Alfredia Ferguson, PA-C   1 year ago Encounters for blood and urine testing   [facility] Alfredia Ferguson, PA-C   1 year ago Essential hypertension   [facility] [place], Marzella Schlein, MD       Future Appointments             In 1 month Bacigalupo, Marzella Schlein, MD [facility]

## 2022-11-03 ENCOUNTER — Ambulatory Visit: Payer: Self-pay | Admitting: *Deleted

## 2022-11-03 ENCOUNTER — Other Ambulatory Visit: Payer: Self-pay

## 2022-11-03 ENCOUNTER — Other Ambulatory Visit: Payer: Self-pay | Admitting: Family Medicine

## 2022-11-03 MED ORDER — LOSARTAN POTASSIUM 50 MG PO TABS: 50.0000 mg | ORAL_TABLET | Freq: Every day | ORAL | 0 refills | Status: DC

## 2022-11-03 NOTE — Telephone Encounter (Signed)
Opened chart to answer question for the agent regarding the losartan 50 mg.   Pt. Is flying out today at noon and needs this refilled.   It looks there was a request for #20 tablets.  There is also another order showing the losartan as pending.   I wasn't able to make out what was going on with the Rx.    Elby Beck, RN had sent it in for correction but it looks like it's still pending Dr. Penelope Coop approval or not. Had agent send to office for Dr Beryle Flock to address since pt is flying out at noon today and needs a decision quickly.

## 2022-11-03 NOTE — Telephone Encounter (Signed)
Pharmacy received refill today 11/03/22. Requested Prescriptions  Refused Prescriptions Disp Refills   losartan (COZAAR) 50 MG tablet [Pharmacy Med Name: LOSARTAN POTASSIUM 50 MG TAB] 90 tablet     Sig: TAKE 1 TABLET BY MOUTH ONCE DAILY     Cardiovascular:  Angiotensin Receptor Blockers Failed - 11/03/2022  9:04 AM      Failed - Last BP in normal range    BP Readings from Last 1 Encounters:  09/13/22 (!) 140/82         Passed - Cr in normal range and within 180 days    Creatinine, Ser  Date Value Ref Range Status  09/11/2022 0.70 0.44 - 1.00 mg/dL Final         Passed - K in normal range and within 180 days    Potassium  Date Value Ref Range Status  09/11/2022 3.6 3.5 - 5.1 mmol/L Final         Passed - Patient is not pregnant      Passed - Valid encounter within last 6 months    Recent Outpatient Visits           5 months ago Essential hypertension   [place] [facility] [place], Marzella Schlein, MD   11 months ago Annual physical exam   [facility] [place], Marzella Schlein, MD   1 year ago Encounter for blood test   [facility] Alfredia Ferguson, PA-C   1 year ago Encounters for blood and urine testing   [facility] Alfredia Ferguson, PA-C   1 year ago Essential hypertension   [place] [facility] [place], Marzella Schlein, MD       Future Appointments             In 1 month Bacigalupo, Marzella Schlein, MD [facility]

## 2022-11-29 ENCOUNTER — Other Ambulatory Visit: Payer: Self-pay | Admitting: Family Medicine

## 2022-12-07 ENCOUNTER — Ambulatory Visit: Payer: BC Managed Care – PPO | Admitting: Family Medicine

## 2022-12-07 ENCOUNTER — Encounter: Payer: Self-pay | Admitting: Family Medicine

## 2022-12-07 VITALS — BP 138/87 | HR 80 | Ht 66.0 in | Wt 149.4 lb

## 2022-12-07 DIAGNOSIS — Z0000 Encounter for general adult medical examination without abnormal findings: Secondary | ICD-10-CM

## 2022-12-07 DIAGNOSIS — R739 Hyperglycemia, unspecified: Secondary | ICD-10-CM

## 2022-12-07 DIAGNOSIS — I10 Essential (primary) hypertension: Secondary | ICD-10-CM | POA: Diagnosis not present

## 2022-12-07 DIAGNOSIS — E782 Mixed hyperlipidemia: Secondary | ICD-10-CM

## 2022-12-07 MED ORDER — LOSARTAN POTASSIUM 50 MG PO TABS: 50.0000 mg | ORAL_TABLET | Freq: Every day | ORAL | 3 refills | Status: DC

## 2022-12-07 NOTE — Assessment & Plan Note (Signed)
Blood pressure is well-controlled on losartan 50 mg daily. - Continue losartan 50 mg daily - Send a 90-day supply with refills

## 2022-12-07 NOTE — Progress Notes (Signed)
Complete physical exam  Patient: Anita Cowan   DOB: 1970/03/04   52 y.o. Female  MRN: [id number]  Subjective:    Chief Complaint  Patient presents with   Annual Exam    Patient reports consuming a gluten free diet mainly consume carnivore and she goes walking 2-3 days a week when possible for at least 3 miles. She reports fairly well due to cold the past few days and sleeps great with no other concerns to report.     Anita Cowan is a 52 y.o. female who presents today for a complete physical exam.   Discussed the use of AI scribe software for clinical note transcription with the patient, who gave verbal consent to proceed.  History of Present Illness   The patient, with a history of hypertension and breast calcifications, presents for a routine physical examination. She reports that her blood pressure is well-controlled on losartan 50mg  daily, and she has not experienced any side effects from the medication. The patient has declined all vaccinations, including the flu and COVID-19 vaccines. She is due for routine screenings, including a Pap smear  and colonoscopy in 2026. The patient's blood sugar levels have been slightly elevated, possibly due to dietary factors, but she denies any symptoms of diabetes.        Most recent fall risk assessment:    12/07/2022    8:53 AM  Fall Risk   Falls in the past year? 0  Number falls in past yr: 0  Injury with Fall? 0  Risk for fall due to : No Fall Risks  Follow up Falls evaluation completed     Most recent depression screenings:    12/07/2022    8:53 AM 11/14/2021    8:54 AM  PHQ 2/9 Scores  PHQ - 2 Score 0 0  PHQ- 9 Score  0        Patient Care Team: Erasmo Downer, MD as PCP - General (Family Medicine)   Outpatient Medications Prior to Visit  Medication Sig   [DISCONTINUED] losartan (COZAAR) 50 MG tablet TAKE 1 TABLET BY MOUTH DAILY   No facility-administered medications prior to visit.     ROS        Objective:     BP 138/87 (BP Location: Left Arm, Patient Position: Sitting, Cuff Size: Normal)   Pulse 80   Ht 5\' 6"  (1.676 m)   Wt 149 lb 6.4 oz (67.8 kg)   SpO2 98%   BMI 24.11 kg/m    Physical Exam Vitals reviewed.  Constitutional:      General: She is not in acute distress.    Appearance: Normal appearance. She is well-developed. She is not diaphoretic.  HENT:     Head: Normocephalic and atraumatic.     Right Ear: Tympanic membrane, ear canal and external ear normal.     Left Ear: Tympanic membrane, ear canal and external ear normal.     Nose: Nose normal.     Mouth/Throat:     Mouth: Mucous membranes are moist.     Pharynx: Oropharynx is clear. No oropharyngeal exudate.  Eyes:     General: No scleral icterus.    Conjunctiva/sclera: Conjunctivae normal.     Pupils: Pupils are equal, round, and reactive to light.  Neck:     Thyroid: No thyromegaly.  Cardiovascular:     Rate and Rhythm: Normal rate and regular rhythm.     Heart sounds: Normal heart sounds. No murmur heard. Pulmonary:  Effort: Pulmonary effort is normal. No respiratory distress.     Breath sounds: Normal breath sounds. No wheezing or rales.  Abdominal:     General: There is no distension.     Palpations: Abdomen is soft.     Tenderness: There is no abdominal tenderness.  Musculoskeletal:        General: No deformity.     Cervical back: Neck supple.     Right lower leg: No edema.     Left lower leg: No edema.  Lymphadenopathy:     Cervical: No cervical adenopathy.  Skin:    General: Skin is warm and dry.     Findings: No rash.  Neurological:     Mental Status: She is alert and oriented to person, place, and time. Mental status is at baseline.     Gait: Gait normal.  Psychiatric:        Mood and Affect: Mood normal.        Behavior: Behavior normal.        Thought Content: Thought content normal.      No results found for any visits on 12/07/22.     Assessment &  Plan:    Routine Health Maintenance and Physical Exam  Immunization History  Administered Date(s) Administered   Janssen (J&J) SARS-COV-2 Vaccination 05/23/2019    Health Maintenance  Topic Date Due   DTaP/Tdap/Td (1 - Tdap) Never done   Zoster Vaccines- Shingrix (1 of 2) Never done   COVID-19 Vaccine (2 - 2023-24 season) 09/24/2022   INFLUENZA VACCINE  04/23/2023 (Originally 08/24/2022)   Cervical Cancer Screening (HPV/Pap Cotest)  03/18/2024   Colonoscopy  08/01/2024   MAMMOGRAM  08/07/2024   Hepatitis C Screening  Completed   HIV Screening  Completed   HPV VACCINES  Aged Out    Discussed health benefits of physical activity, and encouraged her to engage in regular exercise appropriate for her age and condition.  Problem List Items Addressed This Visit       Cardiovascular and Mediastinum   Essential hypertension   Relevant Medications   losartan (COZAAR) 50 MG tablet   Other Relevant Orders   Comprehensive metabolic panel     Other   Moderate mixed hyperlipidemia not requiring statin therapy   Relevant Medications   losartan (COZAAR) 50 MG tablet   Other Relevant Orders   Comprehensive metabolic panel   Lipid panel   Other Visit Diagnoses     Encounter for annual physical exam    -  Primary   Relevant Orders   Hemoglobin A1c   Comprehensive metabolic panel   Lipid panel   Hyperglycemia       Relevant Orders   Hemoglobin A1c           Hyperlipidemia Cholesterol levels have not been checked in a year. Patient consents to cholesterol panel and understands the importance of monitoring kidney and liver function. - Order cholesterol panel with kidney and liver function tests  General Health Maintenance Up to date on most vaccinations and screenings. Declined flu and tetanus shots unless necessary. Due for Pap smear and colonoscopy in 2026. Recent mammogram in July showed no issues post-biopsy. Patient expressed concern about annual mammograms due to frequent  findings requiring intervention. - Pap smear in 2026 - Colonoscopy in 2026 - Continue regular mammograms as per schedule  Follow-up - Schedule six-month follow-up for blood pressure recheck.        Return in about 6 months (around 06/06/2023) for chronic disease  f/u.     Shirlee Latch, MD

## 2022-12-08 LAB — COMPREHENSIVE METABOLIC PANEL
ALT: 17 [IU]/L (ref 0–32)
AST: 19 [IU]/L (ref 0–40)
Albumin: 4.7 g/dL (ref 3.8–4.9)
Alkaline Phosphatase: 51 [IU]/L (ref 44–121)
BUN/Creatinine Ratio: 23 (ref 9–23)
BUN: 15 mg/dL (ref 6–24)
Bilirubin Total: 0.4 mg/dL (ref 0.0–1.2)
CO2: 19 mmol/L — ABNORMAL LOW (ref 20–29)
Calcium: 9.6 mg/dL (ref 8.7–10.2)
Chloride: 101 mmol/L (ref 96–106)
Creatinine, Ser: 0.66 mg/dL (ref 0.57–1.00)
Globulin, Total: 2.5 g/dL (ref 1.5–4.5)
Glucose: 100 mg/dL — ABNORMAL HIGH (ref 70–99)
Potassium: 5 mmol/L (ref 3.5–5.2)
Sodium: 141 mmol/L (ref 134–144)
Total Protein: 7.2 g/dL (ref 6.0–8.5)
eGFR: 106 mL/min/{1.73_m2} (ref >59)

## 2022-12-08 LAB — LIPID PANEL
Chol/HDL Ratio: 3.3 ratio (ref 0.0–4.4)
Cholesterol, Total: 225 mg/dL — ABNORMAL HIGH (ref 100–199)
HDL: 68 mg/dL (ref >39)
LDL Chol Calc (NIH): 143 mg/dL — ABNORMAL HIGH (ref 0–99)
Triglycerides: 83 mg/dL (ref 0–149)
VLDL Cholesterol Cal: 14 mg/dL (ref 5–40)

## 2022-12-08 LAB — HEMOGLOBIN A1C
Est. average glucose Bld gHb Est-mCnc: 105 mg/dL
Hgb A1c MFr Bld: 5.3 % (ref 4.8–5.6)

## 2023-01-02 ENCOUNTER — Ambulatory Visit
Admission: RE | Admit: 2023-01-02 | Discharge: 2023-01-02 | Disposition: A | Discharge status: Home / Self Care | Payer: BC Managed Care – PPO | Source: Ambulatory Visit

## 2023-01-02 VITALS — BP 120/80 | HR 75 | Temp 98.0°F | Resp 18 | Ht 66.0 in | Wt 147.0 lb

## 2023-01-02 DIAGNOSIS — J0100 Acute maxillary sinusitis, unspecified: Secondary | ICD-10-CM

## 2023-01-02 MED ORDER — DOXYCYCLINE HYCLATE 100 MG PO CAPS: 100.0000 mg | ORAL_CAPSULE | Freq: Two times a day (BID) | ORAL | 0 refills | Status: AC

## 2023-01-02 NOTE — Discharge Instructions (Signed)
Take the doxycycline as directed.  Follow-up with your primary care provider if you are not improving.

## 2023-01-02 NOTE — ED Triage Notes (Signed)
Patient to Urgent Care with complaints of nasal congestion, "green colored" drainage, and productive cough. Reports SHOB w/ exertion. Possible fever last night.  Reports symptoms started over one week ago.  No otc meds.

## 2023-01-02 NOTE — ED Provider Notes (Signed)
Anita Cowan    CSN: [id number] Arrival date & time: 01/02/23  1710      History   Chief Complaint Chief Complaint  Patient presents with   Nasal Congestion    Green colored drainage along with cough - Entered by patient    HPI Anita Cowan is a 52 y.o. female.  Patient presents with >1 week history of congestion, postnasal drip, sinus pressure and cough.  She reports shortness of breath with exertion.  No OTC medications today.  No fever, chest pain, or other symptoms.  Her medical history includes hypertension.  The history is provided by the patient and medical records.    Past Medical History:  Diagnosis Date   Abnormal uterine bleeding (AUB)    Adenomatous polyp of descending colon    BRCA negative 03/2019   IBIS=12.8%/riskscore=18.9%   Breast microcalcifications    Endometrial polyp    Essential hypertension    Family history of breast cancer    History of kidney stones    Lower extremity edema    Moderate mixed hyperlipidemia not requiring statin therapy    Ovarian cyst     Patient Active Problem List   Diagnosis Date Noted   Adenomatous polyp of descending colon    Right flank pain 04/04/2021   Endometrial polyp    Moderate mixed hyperlipidemia not requiring statin therapy 03/29/2020   Witnessed episode of apnea 07/18/2019   Family history of breast cancer 03/03/2019   Essential hypertension    Lower extremity edema     Past Surgical History:  Procedure Laterality Date   BREAST BIOPSY Left 06/10/2020   distortion, x marker, path pending   BREAST BIOPSY Right 08/15/2022   right breast stereo bx , coil clip - path pending   BREAST BIOPSY Right 08/15/2022   MM RT BREAST BX W LOC DEV 1ST LESION IMAGE BX SPEC STEREO GUIDE 08/15/2022 [facility]   BREAST BIOPSY Right 09/11/2022   MM RT RADIO FREQUENCY TAG LOC MAMMO GUIDE 09/11/2022 [facility]   BREAST BIOPSY WITH RADIO FREQUENCY LOCALIZER Right 09/13/2022   Procedure: BREAST BIOPSY  WITH RADIO FREQUENCY LOCALIZER;  Surgeon: Carolan Shiver, MD;  Location: [facility];  Service: General;  Laterality: Right;   CHOLECYSTECTOMY     CHOLECYSTECTOMY, LAPAROSCOPIC     COLONOSCOPY WITH PROPOFOL N/A 08/01/2021   Procedure: COLONOSCOPY WITH PROPOFOL;  Surgeon: Toney Reil, MD;  Location: [facility];  Service: Gastroenterology;  Laterality: N/A;   DILATATION & CURETTAGE/HYSTEROSCOPY WITH MYOSURE  07/08/2020   Procedure: DILATATION & CURETTAGE/HYSTEROSCOPY WITH MYOSURE POLYPECTOMY;  Surgeon: Vena Austria, MD;  Location: [facility];  Service: Gynecology;;   DILATION AND CURETTAGE OF UTERUS     DILITATION & CURRETTAGE/HYSTROSCOPY WITH NOVASURE ABLATION N/A 08/03/2020   Procedure: HYSTEROSCOPY WITH NOVASURE ABLATION;  Surgeon: Vena Austria, MD;  Location: [facility];  Service: Gynecology;  Laterality: N/A;   KNEE ARTHROSCOPY Right 1989   TONSILLECTOMY     TUBAL LIGATION      OB History     Gravida  3   Para  3   Term  3   Preterm      AB      Living  3      SAB      IAB      Ectopic      Multiple      Live Births  3            Home Medications    Prior to Admission medications  Medication Sig Start Date End Date Taking? Authorizing Provider  doxycycline (VIBRAMYCIN) 100 MG capsule Take 1 capsule (100 mg total) by mouth 2 (two) times daily for 7 days. 01/02/23 01/09/23 Yes Mickie Bail, NP  losartan (COZAAR) 50 MG tablet Take 1 tablet (50 mg total) by mouth daily. 12/07/22   Erasmo Downer, MD  meloxicam (MOBIC) 15 MG tablet Take 15 mg by mouth daily.    [provider]    Family History Family History  Problem Relation Age of Onset   Hypertension Mother    Lupus Mother        discoid   Cervical cancer Mother    Breast cancer Sister 12   Thyroid nodules Sister        thyroid tumor in her teens   Lung cancer Maternal Grandmother        smoker   Diabetes Maternal Uncle    Breast cancer Maternal Uncle         twice   Bladder Cancer Maternal Uncle    Lung cancer Maternal Uncle    Colon cancer Neg Hx     Social History Social History   Tobacco Use   Smoking status: Never   Smokeless tobacco: Never  Vaping Use   Vaping status: Never Used  Substance Use Topics   Alcohol use: Yes    Comment: rarely   Drug use: Never     Allergies   Codeine, Tramadol, Wheat extract, Azithromycin, Penicillins, and Sulfa antibiotics   Review of Systems Review of Systems  Constitutional:  Negative for chills and fever.  HENT:  Positive for congestion, postnasal drip and sinus pressure. Negative for ear pain and sore throat.   Respiratory:  Positive for cough and shortness of breath.   Cardiovascular:  Negative for chest pain and palpitations.  Gastrointestinal:  Negative for diarrhea and vomiting.     Physical Exam Triage Vital Signs ED Triage Vitals  Encounter Vitals Group     BP 01/02/23 1728 120/80     Systolic BP Percentile --      Diastolic BP Percentile --      Pulse Rate 01/02/23 1719 75     Resp 01/02/23 1719 18     Temp 01/02/23 1719 98 F (36.7 C)     Temp src --      SpO2 01/02/23 1719 97 %     Weight 01/02/23 1727 147 lb (66.7 kg)     Height 01/02/23 1727 5\' 6"  (1.676 m)     Head Circumference --      Peak Flow --      Pain Score 01/02/23 1719 0     Pain Loc --      Pain Education --      Exclude from Growth Chart --    No data found.  Updated Vital Signs BP 120/80   Pulse 75   Temp 98 F (36.7 C)   Resp 18   Ht 5\' 6"  (1.676 m)   Wt 147 lb (66.7 kg)   SpO2 97%   BMI 23.73 kg/m   Visual Acuity Right Eye Distance:   Left Eye Distance:   Bilateral Distance:    Right Eye Near:   Left Eye Near:    Bilateral Near:     Physical Exam Constitutional:      General: She is not in acute distress. HENT:     Right Ear: Tympanic membrane normal.     Left Ear: Tympanic membrane normal.  Nose: Congestion present.     Mouth/Throat:     Mouth: Mucous membranes  are moist.     Pharynx: Oropharynx is clear.  Cardiovascular:     Rate and Rhythm: Normal rate and regular rhythm.     Heart sounds: Normal heart sounds.  Pulmonary:     Effort: Pulmonary effort is normal. No respiratory distress.     Breath sounds: Normal breath sounds.  Skin:    General: Skin is warm and dry.  Neurological:     Mental Status: She is alert.      UC Treatments / Results  Labs (all labs ordered are listed, but only abnormal results are displayed) Labs Reviewed - No data to display  EKG   Radiology No results found.  Procedures Procedures (including critical care time)  Medications Ordered in UC Medications - No data to display  Initial Impression / Assessment and Plan / UC Course  I have reviewed the triage vital signs and the nursing notes.  Pertinent labs & imaging results that were available during my care of the patient were reviewed by me and considered in my medical decision making (see chart for details).    Acute sinusitis.  Patient has been symptomatic for more than a week and is not improving.  She is allergic to several medications.  Treating today with doxycycline.  Education provided on acute sinusitis.  Instructed patient to follow up with her PCP if her symptoms are not improving.  She agrees to plan of care.   Final Clinical Impressions(s) / UC Diagnoses   Final diagnoses:  Acute non-recurrent maxillary sinusitis     Discharge Instructions      Take the doxycycline as directed.  Follow-up with your primary care provider if you are not improving.     ED Prescriptions     Medication Sig Dispense Auth. Provider   doxycycline (VIBRAMYCIN) 100 MG capsule Take 1 capsule (100 mg total) by mouth 2 (two) times daily for 7 days. 14 capsule Mickie Bail, NP      PDMP not reviewed this encounter.   Mickie Bail, NP 01/02/23 1800

## 2023-02-01 IMAGING — MG MM DIGITAL SCREENING BILAT W/ TOMO AND CAD
6 of 10 series · 6 of 30 positions shown · non-contrast
Comparison: Previous exam(s).

CLINICAL DATA: Screening. History of benign LEFT breast
stereotactic biopsy in 6466 with benign pathology result (stromal
fibrosis).

EXAM:
DIGITAL SCREENING BILATERAL MAMMOGRAM WITH TOMOSYNTHESIS AND CAD
TECHNIQUE: Bilateral screening digital craniocaudal and mediolateral oblique
mammograms were obtained. Bilateral screening digital breast
tomosynthesis was performed. The images were evaluated with
computer-aided detection.

[L MLO synth-2D]
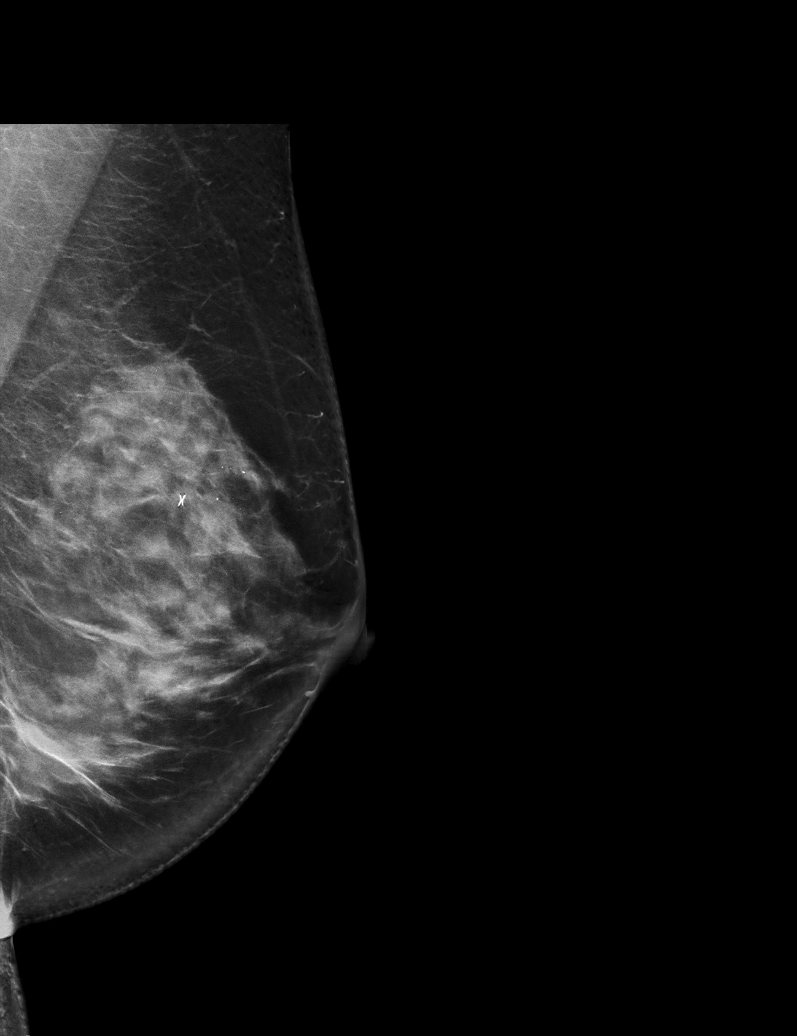

[R MLO synth-2D (1 of 2)]
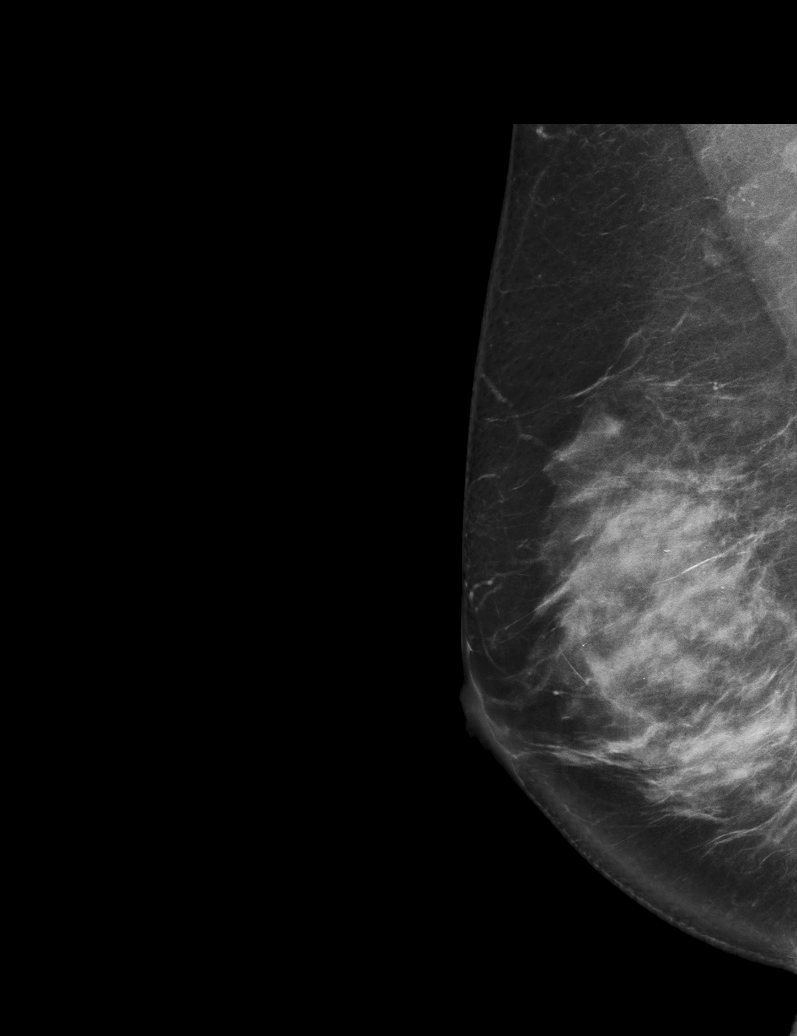

[R CC synth-2D]
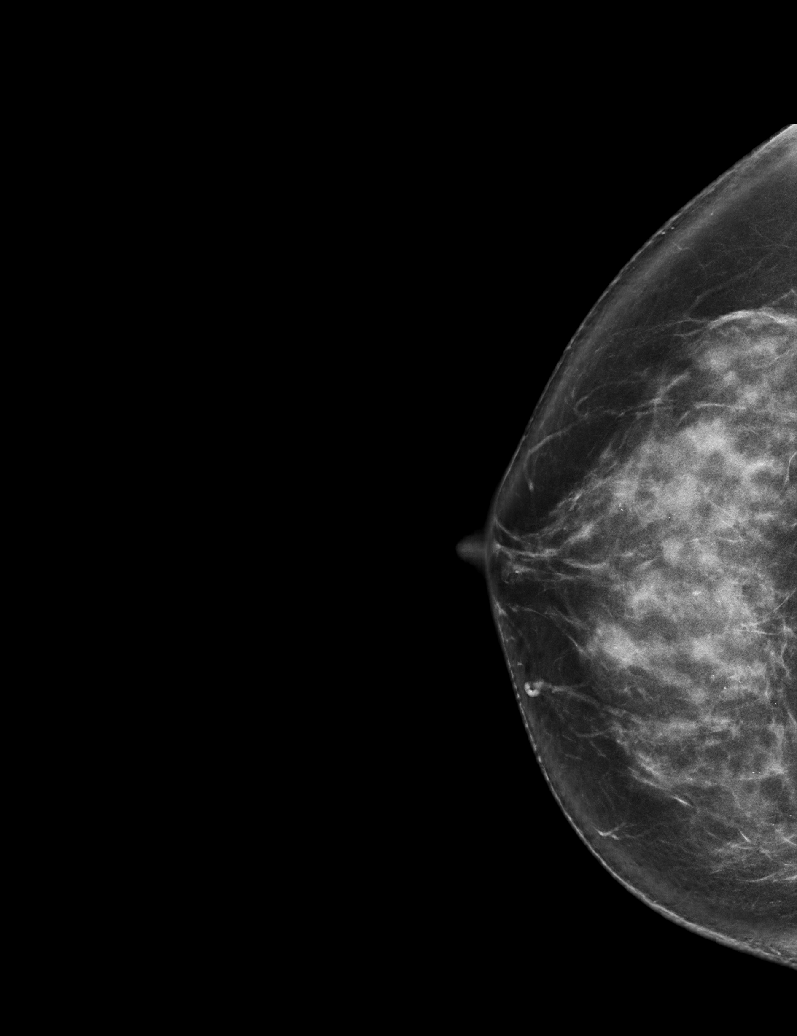

[R MLO synth-2D (2 of 2)]
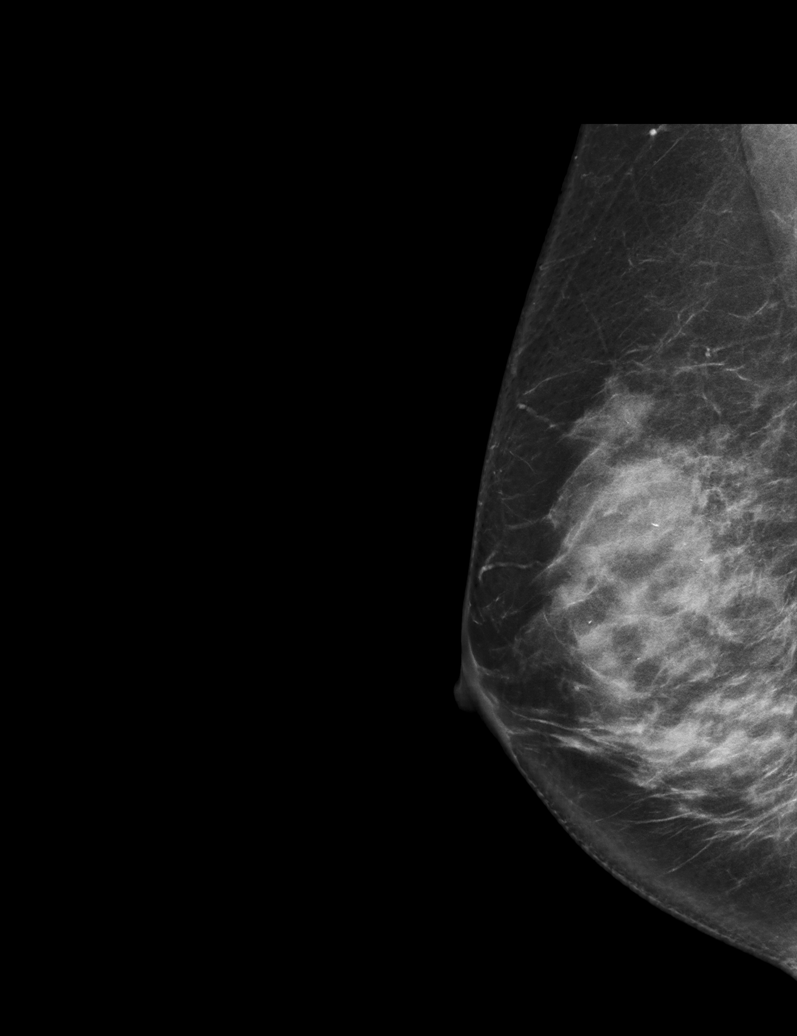

[L CC synth-2D]
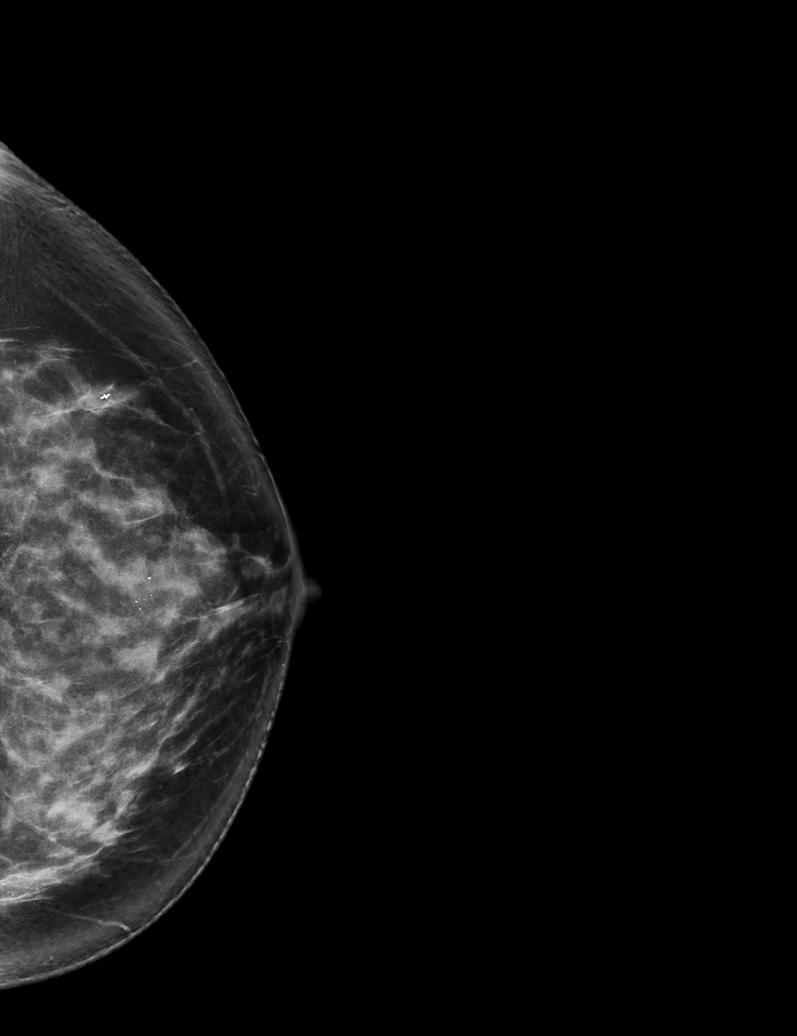

[L CC tomo · tomo slice 37/74.0]
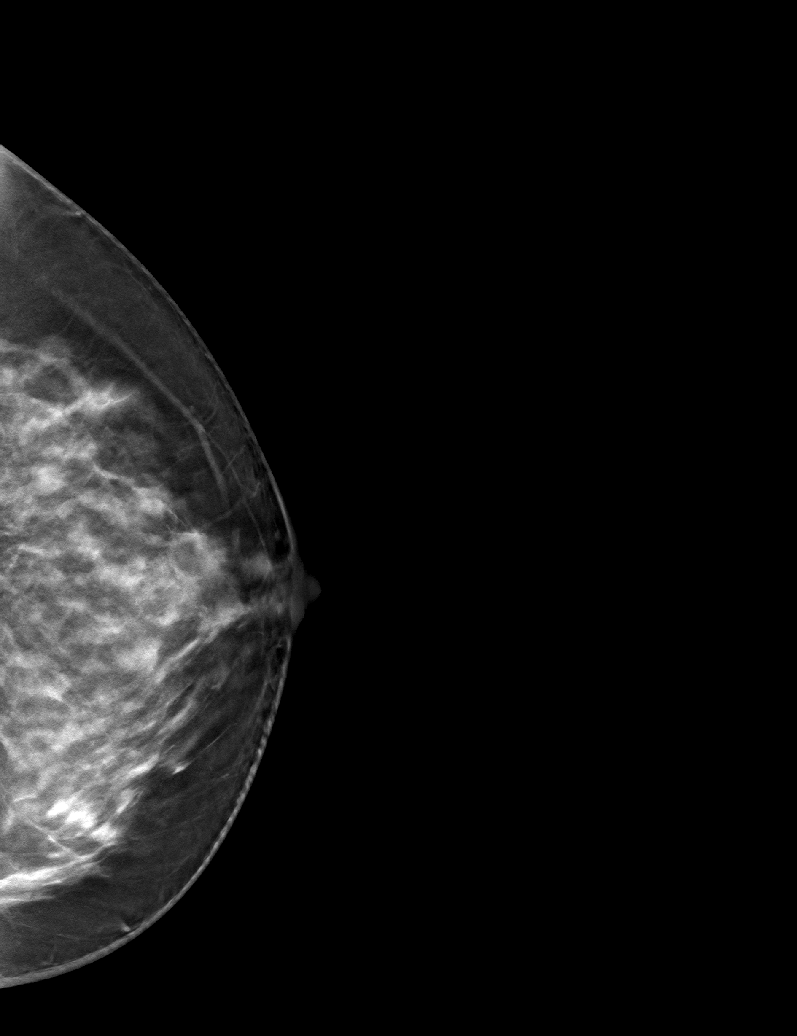

[6 of 30 positions shown; findings below may reference images not displayed]

ACR Breast Density Category c: The breast tissue is heterogeneously
dense, which may obscure small masses.
FINDINGS: There are no findings suspicious for malignancy.
IMPRESSION: No mammographic evidence of malignancy. A result letter of this
screening mammogram will be mailed directly to the patient.

RECOMMENDATION:
Screening mammogram in one year. (Code:UO-F-X6D)

BI-RADS CATEGORY  1: Negative.

## 2023-05-22 ENCOUNTER — Other Ambulatory Visit: Payer: Self-pay | Admitting: Family Medicine

## 2023-05-22 DIAGNOSIS — Z1231 Encounter for screening mammogram for malignant neoplasm of breast: Secondary | ICD-10-CM

## 2023-06-07 ENCOUNTER — Ambulatory Visit: Payer: Self-pay | Admitting: Family Medicine

## 2023-07-17 ENCOUNTER — Encounter: Payer: Self-pay | Admitting: Family Medicine

## 2023-07-17 ENCOUNTER — Ambulatory Visit: Payer: Self-pay | Admitting: Family Medicine

## 2023-07-17 VITALS — BP 122/78 | HR 79 | Ht 66.0 in | Wt 164.5 lb

## 2023-07-17 DIAGNOSIS — G8929 Other chronic pain: Secondary | ICD-10-CM

## 2023-07-17 DIAGNOSIS — I10 Essential (primary) hypertension: Secondary | ICD-10-CM

## 2023-07-17 DIAGNOSIS — E782 Mixed hyperlipidemia: Secondary | ICD-10-CM | POA: Diagnosis not present

## 2023-07-17 DIAGNOSIS — R0683 Snoring: Secondary | ICD-10-CM

## 2023-07-17 DIAGNOSIS — R0681 Apnea, not elsewhere classified: Secondary | ICD-10-CM

## 2023-07-17 DIAGNOSIS — G478 Other sleep disorders: Secondary | ICD-10-CM

## 2023-07-17 DIAGNOSIS — M25511 Pain in right shoulder: Secondary | ICD-10-CM

## 2023-07-17 DIAGNOSIS — R600 Localized edema: Secondary | ICD-10-CM | POA: Diagnosis not present

## 2023-07-17 MED ORDER — CELECOXIB 100 MG PO CAPS: 100.0000 mg | ORAL_CAPSULE | Freq: Two times a day (BID) | ORAL | 2 refills | Status: AC

## 2023-07-17 MED ORDER — HYDROCHLOROTHIAZIDE 25 MG PO TABS: 25.0000 mg | ORAL_TABLET | Freq: Every day | ORAL | 2 refills | Status: DC | PRN

## 2023-07-17 NOTE — Assessment & Plan Note (Signed)
 Hypertension is managed with losartan  50 mg daily. She reports not taking hydrochlorothiazide  daily due to leg tingling. Blood pressure today was 122/78 mmHg, which is satisfactory. Meloxicam for shoulder pain may contribute to elevated blood pressure. A switch to Celebrex, which may have fewer gastrointestinal side effects and less impact on blood pressure, was discussed. - Continue losartan  50 mg daily. - Prescribe hydrochlorothiazide  25 mg with 30 tablets and refills, to be taken as needed. - Monitor blood pressure regularly. - Order renal function and electrolytes tests to monitor effects of losartan  and hydrochlorothiazide . - Switch to Celebrex 100 mg, to be taken once or twice daily as needed, pending insurance approval.

## 2023-07-17 NOTE — Assessment & Plan Note (Signed)
 Stable at last labs Continue to monitor annually

## 2023-07-17 NOTE — Progress Notes (Signed)
 Established patient visit   Patient: Anita Cowan   DOB: Jul 30, 1970   53 y.o. Female  MRN: [id number] Visit Date: 07/17/2023  Today's healthcare provider: Jon Eva, MD   Chief Complaint  Patient presents with   Medical Management of Chronic Issues   Hypertension    She reports taking medications as prescribed. No smoking. Reports lower leg edema at times.  She reports knowing blood pressure would be high today due to waking up with a headache and having some spotting in her vision.    Care Management    Declined all vaccine (Zoster, Tdap, Hepatitis B)   Subjective    Hypertension   HPI     Hypertension    Additional comments: She reports taking medications as prescribed. No smoking. Reports lower leg edema at times.  She reports knowing blood pressure would be high today due to waking up with a headache and having some spotting in her vision.         Care Management    Additional comments: Declined all vaccine (Zoster, Tdap, Hepatitis B)      Last edited by Lilian Fitzpatrick, CMA on 07/17/2023  1:58 PM.       Discussed the use of AI scribe software for clinical note transcription with the patient, who gave verbal consent to proceed.  History of Present Illness   Anita Cowan is a 53 year old female with hypertension who presents for a follow-up on blood pressure management.  She manages hypertension with losartan  50 mg daily and uses hydrochlorothiazide  25 mg sparingly due to tingling in her legs. She did not take hydrochlorothiazide  this morning but consistently takes losartan . Meloxicam 15 mg, prescribed for shoulder pain, increases her blood pressure significantly, so she uses it every three to four days when pain is severe. Physical therapy has not improved her shoulder condition, and imaging shows a small bone spur and a tiny tear with inflammation.  Her cholesterol levels are around 220 mg/dL. She has not had recent labs for kidney function and  electrolytes, which are typically monitored every six months.  She inquires about the effect of magnesium on blood pressure, noting drowsiness with magnesium glycinate. She is interested in a sleep study due to snoring and non-restorative sleep, with reports of apnea during sleep.         Medications: Outpatient Medications Prior to Visit  Medication Sig   losartan  (COZAAR ) 50 MG tablet Take 1 tablet (50 mg total) by mouth daily.   [DISCONTINUED] hydrochlorothiazide  (HYDRODIURIL ) 25 MG tablet Take 25 mg by mouth daily as needed.   [DISCONTINUED] meloxicam (MOBIC) 15 MG tablet Take 15 mg by mouth daily.   No facility-administered medications prior to visit.    Review of Systems     Objective    BP 122/78 (BP Location: Left Arm, Patient Position: Sitting, Cuff Size: Normal)   Pulse 79   Ht 5' 6 (1.676 m)   Wt 164 lb 8 oz (74.6 kg)   SpO2 99%   BMI 26.55 kg/m    Physical Exam Vitals reviewed.  Constitutional:      General: She is not in acute distress.    Appearance: Normal appearance. She is well-developed. She is not diaphoretic.  HENT:     Head: Normocephalic and atraumatic.   Eyes:     General: No scleral icterus.    Conjunctiva/sclera: Conjunctivae normal.   Neck:     Thyroid: No thyromegaly.   Cardiovascular:  Rate and Rhythm: Normal rate and regular rhythm.     Heart sounds: Normal heart sounds. No murmur heard. Pulmonary:     Effort: Pulmonary effort is normal. No respiratory distress.     Breath sounds: Normal breath sounds. No wheezing, rhonchi or rales.   Musculoskeletal:     Cervical back: Neck supple.     Right lower leg: No edema.     Left lower leg: No edema.  Lymphadenopathy:     Cervical: No cervical adenopathy.   Skin:    General: Skin is warm and dry.     Findings: No rash.   Neurological:     Mental Status: She is alert and oriented to person, place, and time. Mental status is at baseline.   Psychiatric:        Mood and Affect:  Mood normal.        Behavior: Behavior normal.      No results found for any visits on 07/17/23.  Assessment & Plan     Problem List Items Addressed This Visit       Cardiovascular and Mediastinum   Essential hypertension - Primary   Hypertension is managed with losartan  50 mg daily. She reports not taking hydrochlorothiazide  daily due to leg tingling. Blood pressure today was 122/78 mmHg, which is satisfactory. Meloxicam for shoulder pain may contribute to elevated blood pressure. A switch to Celebrex, which may have fewer gastrointestinal side effects and less impact on blood pressure, was discussed. - Continue losartan  50 mg daily. - Prescribe hydrochlorothiazide  25 mg with 30 tablets and refills, to be taken as needed. - Monitor blood pressure regularly. - Order renal function and electrolytes tests to monitor effects of losartan  and hydrochlorothiazide . - Switch to Celebrex 100 mg, to be taken once or twice daily as needed, pending insurance approval.      Relevant Medications   hydrochlorothiazide  (HYDRODIURIL ) 25 MG tablet   Other Relevant Orders   Basic Metabolic Panel (BMET)     Other   Lower extremity edema   Witnessed episode of apnea   Relevant Orders   Ambulatory referral to Sleep Studies   Moderate mixed hyperlipidemia not requiring statin therapy   Stable at last labs Continue to monitor annually      Relevant Medications   hydrochlorothiazide  (HYDRODIURIL ) 25 MG tablet   Other Visit Diagnoses       Snoring       Relevant Orders   Ambulatory referral to Sleep Studies     Non-restorative sleep       Relevant Orders   Ambulatory referral to Sleep Studies     Chronic right shoulder pain       Relevant Medications   celecoxib (CELEBREX) 100 MG capsule           Shoulder pain with possible inflammation Chronic shoulder pain with possible inflammation. A small rotator cuff tear and bone spur are present but not the primary pain sources. Meloxicam was  previously used but may elevate blood pressure. Physical therapy has been ineffective. Celebrex was discussed as an alternative to meloxicam, with a lower risk of gastrointestinal bleeding. - Switch to Celebrex 100 mg, to be taken once or twice daily as needed. - Monitor for Celebrex side effects, including potential gastrointestinal bleeding and renal effects.  Snoring with suspected sleep apnea Snoring and non-restorative sleep with witnessed apneas suggest sleep apnea. A previous sleep study was not completed due to COVID testing requirements. A home sleep study is now an option,  which may be more comfortable and convenient. - Order home sleep study through [employer], pending insurance approval.        Return in about 5 months (around 12/17/2023) for CPE.       Jon Eva, MD  [facility] [phone] (phone) [phone] (fax)  [facility]

## 2023-07-18 LAB — BASIC METABOLIC PANEL WITH GFR
BUN/Creatinine Ratio: 22 (ref 9–23)
BUN: 16 mg/dL (ref 6–24)
CO2: 23 mmol/L (ref 20–29)
Calcium: 9.1 mg/dL (ref 8.7–10.2)
Chloride: 102 mmol/L (ref 96–106)
Creatinine, Ser: 0.72 mg/dL (ref 0.57–1.00)
Glucose: 130 mg/dL — ABNORMAL HIGH (ref 70–99)
Potassium: 3.9 mmol/L (ref 3.5–5.2)
Sodium: 139 mmol/L (ref 134–144)
eGFR: 101 mL/min/{1.73_m2} (ref >59)

## 2023-07-23 ENCOUNTER — Ambulatory Visit: Payer: Self-pay | Admitting: Family Medicine

## 2023-08-09 ENCOUNTER — Ambulatory Visit
Admission: RE | Admit: 2023-08-09 | Discharge: 2023-08-09 | Disposition: A | Discharge status: Home / Self Care | Payer: Self-pay | Source: Ambulatory Visit | Attending: Family Medicine | Admitting: Family Medicine

## 2023-08-09 DIAGNOSIS — Z1231 Encounter for screening mammogram for malignant neoplasm of breast: Secondary | ICD-10-CM | POA: Diagnosis present

## 2023-08-14 ENCOUNTER — Ambulatory Visit: Payer: Self-pay | Admitting: Family Medicine

## 2023-09-03 ENCOUNTER — Encounter: Payer: Self-pay | Admitting: Family Medicine

## 2023-09-03 NOTE — Progress Notes (Signed)
 Sleep study shows AHI of 53.8, which means that every hour on average she stopped breathing or dropped her oxygen levels 54 times.  This corresponds to severe sleep apnea. Recommend autopap with mask of patient's choice and heated humidity and all necessary tubing and filters - please let patient know. I will sign orders and placei nbox up front.

## 2023-09-04 ENCOUNTER — Encounter: Payer: Self-pay | Admitting: Family Medicine

## 2023-09-04 NOTE — Progress Notes (Signed)
 Called and was able to inform the pt of her sleep study results per Dr B she stated she understood, orders has been placed in Dr B basket to be completed

## 2023-11-15 ENCOUNTER — Ambulatory Visit: Admission: EM | Admit: 2023-11-15 | Discharge: 2023-11-15 | Disposition: A | Discharge status: Home / Self Care

## 2023-11-15 DIAGNOSIS — J069 Acute upper respiratory infection, unspecified: Secondary | ICD-10-CM

## 2023-11-15 DIAGNOSIS — J040 Acute laryngitis: Secondary | ICD-10-CM

## 2023-11-15 NOTE — Discharge Instructions (Addendum)
 Take Tylenol or ibuprofen as needed for fever or discomfort.  Take plain Mucinex as needed for congestion.  Rest and keep yourself hydrated.    Follow-up with your primary care provider if your symptoms are not improving.

## 2023-11-15 NOTE — ED Triage Notes (Signed)
 Patient to Urgent Care with complaints of productive cough w/ green mucus. Hoarseness. No fevers.   Symptoms started Sunday.   Taking motrin .

## 2023-11-15 NOTE — ED Provider Notes (Signed)
 Anita Cowan    CSN: [id number] Arrival date & time: 11/15/23  0841      History   Chief Complaint Chief Complaint  Patient presents with   Cough    HPI Anita Cowan is a 53 y.o. female.  Patient presents with 4-day history of congestion and cough.  Her voice became hoarse yesterday.  No fever, ear pain, sore throat, shortness of breath, vomiting, diarrhea.  She took ibuprofen  yesterday but no OTC medications taken today.  The history is provided by the patient and medical records.    Past Medical History:  Diagnosis Date   Abnormal uterine bleeding (AUB)    Adenomatous polyp of descending colon    BRCA negative 03/2019   IBIS=12.8%/riskscore=18.9%   Breast microcalcifications    Endometrial polyp    Essential hypertension    Family history of breast cancer    History of kidney stones    Lower extremity edema    Moderate mixed hyperlipidemia not requiring statin therapy    Ovarian cyst     Patient Active Problem List   Diagnosis Date Noted   Adenomatous polyp of descending colon    Right flank pain 04/04/2021   Endometrial polyp    Moderate mixed hyperlipidemia not requiring statin therapy 03/29/2020   Witnessed episode of apnea 07/18/2019   Family history of breast cancer 03/03/2019   Essential hypertension    Lower extremity edema     Past Surgical History:  Procedure Laterality Date   BREAST BIOPSY Left 06/10/2020   distortion, x marker, path pending   BREAST BIOPSY Right 08/15/2022   right breast stereo bx , coil clip - CSL removed   BREAST BIOPSY Right 08/15/2022   MM RT BREAST BX W LOC DEV 1ST LESION IMAGE BX SPEC STEREO GUIDE 08/15/2022 [facility]   BREAST BIOPSY Right 09/11/2022   MM RT RADIO FREQUENCY TAG LOC MAMMO GUIDE 09/11/2022 [facility]   BREAST BIOPSY WITH RADIO FREQUENCY LOCALIZER Right 09/13/2022   Procedure: BREAST BIOPSY WITH RADIO FREQUENCY LOCALIZER;  Surgeon: Rodolph Romano, MD;  Location: [facility];   Service: General;  Laterality: Right;   BREAST EXCISIONAL BIOPSY Right 09/13/2022   CSL removed   CHOLECYSTECTOMY     CHOLECYSTECTOMY, LAPAROSCOPIC     COLONOSCOPY WITH PROPOFOL  N/A 08/01/2021   Procedure: COLONOSCOPY WITH PROPOFOL ;  Surgeon: Unk Corinn Skiff, MD;  Location: [facility];  Service: Gastroenterology;  Laterality: N/A;   DILATATION & CURETTAGE/HYSTEROSCOPY WITH MYOSURE  07/08/2020   Procedure: DILATATION & CURETTAGE/HYSTEROSCOPY WITH MYOSURE POLYPECTOMY;  Surgeon: Lake Read, MD;  Location: [facility];  Service: Gynecology;;   DILATION AND CURETTAGE OF UTERUS     DILITATION & CURRETTAGE/HYSTROSCOPY WITH NOVASURE ABLATION N/A 08/03/2020   Procedure: HYSTEROSCOPY WITH NOVASURE ABLATION;  Surgeon: Lake Read, MD;  Location: [facility];  Service: Gynecology;  Laterality: N/A;   KNEE ARTHROSCOPY Right 1989   TONSILLECTOMY     TUBAL LIGATION      OB History     Gravida  3   Para  3   Term  3   Preterm      AB      Living  3      SAB      IAB      Ectopic      Multiple      Live Births  3            Home Medications    Prior to Admission medications   Medication Sig Start Date  End Date Taking? Authorizing Provider  celecoxib  (CELEBREX ) 100 MG capsule Take 1 capsule (100 mg total) by mouth 2 (two) times daily. 07/17/23   Bacigalupo, Angela M, MD  hydrochlorothiazide  (HYDRODIURIL ) 25 MG tablet Take 1 tablet (25 mg total) by mouth daily as needed. 07/17/23   Myrla Jon HERO, MD  losartan  (COZAAR ) 50 MG tablet Take 1 tablet (50 mg total) by mouth daily. 12/07/22   Myrla Jon HERO, MD    Family History Family History  Problem Relation Age of Onset   Hypertension Mother    Lupus Mother        discoid   Cervical cancer Mother    Breast cancer Sister 38   Thyroid nodules Sister        thyroid tumor in her teens   Lung cancer Maternal Grandmother        smoker   Varicose Veins Maternal Grandmother    Diabetes Maternal Uncle     Breast cancer Maternal Uncle        twice   Bladder Cancer Maternal Uncle    Lung cancer Maternal Uncle    Colon cancer Neg Hx     Social History Social History   Tobacco Use   Smoking status: Never   Smokeless tobacco: Never  Vaping Use   Vaping status: Never Used  Substance Use Topics   Alcohol use: Yes    Comment: rarely   Drug use: Never     Allergies   Azithromycin, Codeine, Tramadol, Wheat, Wheat extract, Penicillins, and Sulfa antibiotics   Review of Systems Review of Systems  Constitutional:  Negative for chills and fever.  HENT:  Positive for congestion, postnasal drip and voice change. Negative for ear pain, sore throat and trouble swallowing.   Respiratory:  Positive for cough. Negative for shortness of breath.   Cardiovascular:  Negative for chest pain and palpitations.  Gastrointestinal:  Negative for diarrhea and vomiting.     Physical Exam Triage Vital Signs ED Triage Vitals  Encounter Vitals Group     BP 11/15/23 0925 138/89     Girls Systolic BP Percentile --      Girls Diastolic BP Percentile --      Boys Systolic BP Percentile --      Boys Diastolic BP Percentile --      Pulse Rate 11/15/23 0925 87     Resp 11/15/23 0925 18     Temp 11/15/23 0925 97.8 F (36.6 C)     Temp src --      SpO2 11/15/23 0925 98 %     Weight --      Height --      Head Circumference --      Peak Flow --      Pain Score 11/15/23 0930 2     Pain Loc --      Pain Education --      Exclude from Growth Chart --    No data found.  Updated Vital Signs BP 138/89   Pulse 87   Temp 97.8 F (36.6 C)   Resp 18   SpO2 98%   Visual Acuity Right Eye Distance:   Left Eye Distance:   Bilateral Distance:    Right Eye Near:   Left Eye Near:    Bilateral Near:     Physical Exam Constitutional:      General: She is not in acute distress. HENT:     Right Ear: Tympanic membrane normal.     Left  Ear: Tympanic membrane normal.     Nose: Nose normal.      Mouth/Throat:     Mouth: Mucous membranes are moist.     Pharynx: Oropharynx is clear.     Comments: Voice hoarse. Clear PND.  Cardiovascular:     Rate and Rhythm: Normal rate and regular rhythm.     Heart sounds: Normal heart sounds.  Pulmonary:     Effort: Pulmonary effort is normal. No respiratory distress.     Breath sounds: Normal breath sounds.  Neurological:     Mental Status: She is alert.      UC Treatments / Results  Labs (all labs ordered are listed, but only abnormal results are displayed) Labs Reviewed - No data to display  EKG   Radiology No results found.  Procedures Procedures (including critical care time)  Medications Ordered in UC Medications - No data to display  Initial Impression / Assessment and Plan / UC Course  I have reviewed the triage vital signs and the nursing notes.  Pertinent labs & imaging results that were available during my care of the patient were reviewed by me and considered in my medical decision making (see chart for details).    Laryngitis, viral URI.  Afebrile and vital signs are stable.  Lungs are clear and O2 sat is 98% on room air.  Patient declines prescription for prednisone.  Discussed symptomatic treatment including Tylenol  or ibuprofen , plain Mucinex, salt water  gargles, warm tea with honey.  Instructed her to follow-up with her PCP if she is not improving.  Work note provided.  Patient agrees to plan of care.  Final Clinical Impressions(s) / UC Diagnoses   Final diagnoses:  Laryngitis  Viral URI     Discharge Instructions      Take Tylenol  or ibuprofen  as needed for fever or discomfort.  Take plain Mucinex as needed for congestion.  Rest and keep yourself hydrated.    Follow-up with your primary care provider if your symptoms are not improving.         ED Prescriptions   None    PDMP not reviewed this encounter.   Corlis Burnard DEL, NP 11/15/23 1013

## 2023-11-28 ENCOUNTER — Telehealth: Payer: Self-pay

## 2023-11-28 DIAGNOSIS — G478 Other sleep disorders: Secondary | ICD-10-CM

## 2023-11-28 DIAGNOSIS — R0681 Apnea, not elsewhere classified: Secondary | ICD-10-CM

## 2023-11-28 DIAGNOSIS — R0683 Snoring: Secondary | ICD-10-CM

## 2023-11-28 NOTE — Telephone Encounter (Signed)
 We can order a ref to sleep study and request split night study (this will retest and get her CPAP titration)

## 2023-11-28 NOTE — Telephone Encounter (Signed)
 Copied from CRM [id number]. Topic: General - Other >> Nov 28, 2023 12:01 PM Yolanda T wrote: Reason for CRM: patient stated she never recvd a call from anyone about a CPAP machine after the home sleep study she recvd. She also would like to have an in person sleep study done before making a decision to use a CPAP. Please f/u with patient

## 2023-11-30 NOTE — Telephone Encounter (Signed)
 Pt reports she would prefer to just do another in person sleep study but if you feel the sleep study is the next best step then she will

## 2023-12-03 NOTE — Telephone Encounter (Signed)
 A split night study is an in person sleep study. We just have to specify the type of in person sleep study that we want

## 2023-12-04 NOTE — Telephone Encounter (Signed)
 Pt advised. Verbalized understanding. Order placed

## 2023-12-05 ENCOUNTER — Encounter: Payer: Self-pay | Admitting: Family Medicine

## 2023-12-06 ENCOUNTER — Ambulatory Visit: Payer: Self-pay | Admitting: Family Medicine

## 2023-12-17 NOTE — Telephone Encounter (Signed)
 Contacted patient to advise her insurance will not cover a in-person sleep study. We would have to treat her apnea based off her previous results. Is she okay with Cpap order being placed?  Ok for E2C2 to advise and verify she is okay with order being placed based off her sleep study results from 08/23/23. If so her order will be sent to [facility] here in [place] and they should contact her within 7 business days (may be longer due to thanksgiving) if she does not hear from anyone please let us  know.

## 2023-12-24 ENCOUNTER — Ambulatory Visit (INDEPENDENT_AMBULATORY_CARE_PROVIDER_SITE_OTHER): Admitting: Family Medicine

## 2023-12-24 ENCOUNTER — Encounter: Payer: Self-pay | Admitting: Family Medicine

## 2023-12-24 VITALS — BP 149/96 | HR 82 | Ht 66.0 in | Wt 173.2 lb

## 2023-12-24 DIAGNOSIS — I10 Essential (primary) hypertension: Secondary | ICD-10-CM | POA: Diagnosis not present

## 2023-12-24 DIAGNOSIS — Z0000 Encounter for general adult medical examination without abnormal findings: Secondary | ICD-10-CM

## 2023-12-24 DIAGNOSIS — E782 Mixed hyperlipidemia: Secondary | ICD-10-CM

## 2023-12-24 DIAGNOSIS — G4733 Obstructive sleep apnea (adult) (pediatric): Secondary | ICD-10-CM | POA: Diagnosis not present

## 2023-12-24 DIAGNOSIS — R739 Hyperglycemia, unspecified: Secondary | ICD-10-CM | POA: Diagnosis not present

## 2023-12-24 MED ORDER — HYDROCHLOROTHIAZIDE 25 MG PO TABS: 25.0000 mg | ORAL_TABLET | Freq: Every day | ORAL | 1 refills | Status: AC | PRN

## 2023-12-24 MED ORDER — LOSARTAN POTASSIUM 50 MG PO TABS: 50.0000 mg | ORAL_TABLET | Freq: Every day | ORAL | 1 refills | Status: AC

## 2023-12-24 NOTE — Progress Notes (Unsigned)
 Complete physical exam   Patient: Anita Cowan   DOB: 02/26/1970   53 y.o. Female  MRN: [id number] Visit Date: 12/24/2023  Today's healthcare provider: Jon Eva, MD   Chief Complaint  Patient presents with  . Annual Exam    Last completed 12/07/22 Diet - none   Exercise - walking 2-3 days a week if weather permits Feeling - well Sleeping - well Concerns -  none   Subjective    Anita Cowan is a 53 y.o. female who presents today for a complete physical exam.   Discussed the use of AI scribe software for clinical note transcription with the patient, who gave verbal consent to proceed.  History of Present Illness           Last depression screening scores    12/24/2023    4:10 PM 12/07/2022    8:53 AM 11/14/2021    8:54 AM  PHQ 2/9 Scores  PHQ - 2 Score 0 0 0  PHQ- 9 Score   0      Data saved with a previous flowsheet row definition   Last fall risk screening    12/24/2023    4:10 PM  Fall Risk   Falls in the past year? 0  Number falls in past yr: 0  Injury with Fall? 0  Risk for fall due to : No Fall Risks  Follow up Falls evaluation completed    {VISON DENTAL STD PSA (Optional):27386}  {History (Optional):23778}  Medications: Outpatient Medications Prior to Visit  Medication Sig  . celecoxib  (CELEBREX ) 100 MG capsule Take 1 capsule (100 mg total) by mouth 2 (two) times daily.  . hydrochlorothiazide  (HYDRODIURIL ) 25 MG tablet Take 1 tablet (25 mg total) by mouth daily as needed.  . losartan  (COZAAR ) 50 MG tablet Take 1 tablet (50 mg total) by mouth daily.   No facility-administered medications prior to visit.    Review of Systems {Insert previous labs (optional):23779} {See past labs  Heme  Chem  Endocrine  Serology  Results Review (optional):1}  Objective    BP (!) 149/96 (BP Location: Left Arm, Patient Position: Sitting, Cuff Size: Normal)   Pulse 82   Ht 5' 6 (1.676 m)   Wt 173 lb 3.2 oz (78.6 kg)   SpO2 99%   BMI  27.96 kg/m  {Insert last BP/Wt (optional):23777}{See vitals history (optional):1}  Physical Exam   No results found for any visits on 12/24/23.  Assessment & Plan    Routine Health Maintenance and Physical Exam  Exercise Activities and Dietary recommendations  Goals   None     Immunization History  Administered Date(s) Administered  . Janssen (J&J) SARS-COV-2 Vaccination 05/23/2019    Health Maintenance  Topic Date Due  . DTaP/Tdap/Td (1 - Tdap) Never done  . Hepatitis B Vaccines 19-59 Average Risk (1 of 3 - 19+ 3-dose series) Never done  . Pneumococcal Vaccine: 50+ Years (1 of 1 - PCV) Never done  . Zoster Vaccines- Shingrix (1 of 2) Never done  . COVID-19 Vaccine (2 - 2025-26 season) 09/24/2023  . Influenza Vaccine  04/22/2024 (Originally 08/24/2023)  . Cervical Cancer Screening (HPV/Pap Cotest)  03/18/2024  . Colonoscopy  08/01/2024  . Mammogram  08/08/2025  . Hepatitis C Screening  Completed  . HIV Screening  Completed  . HPV VACCINES  Aged Out  . Meningococcal B Vaccine  Aged Out    Discussed health benefits of physical activity, and encouraged her to engage in regular exercise  appropriate for her age and condition.  Problem List Items Addressed This Visit   None   Assessment and Plan               No follow-ups on file.     Jon Eva, MD  [facility] [phone] (phone) [phone] (fax)  [facility]

## 2023-12-25 NOTE — Assessment & Plan Note (Signed)
 Severe obstructive sleep apnea with approximately 54 apneic events per hour. CPAP therapy is recommended as the primary treatment. Discussed alternative treatments such as Inspire device and mouth tape, but she is not interested in these options. CPAP therapy is expected to improve blood pressure and overall sleep quality. Discussed the use of humidified CPAP with distilled water  to prevent bacterial contamination and potential sinus benefits. - Initiated CPAP therapy with nasal pillow mask - Ensured use of distilled water  in CPAP humidifier - Arranged for CPAP company to contact her for setup - Will schedule follow-up in six months to assess CPAP efficacy

## 2023-12-25 NOTE — Assessment & Plan Note (Signed)
 Due for routine lab work to assess cholesterol levels. Discussed the option of fasting for better cholesterol readings. - Ordered fasting lipid panel - Scheduled lab work before leaving the clinic

## 2023-12-25 NOTE — Assessment & Plan Note (Signed)
 Blood pressure was elevated upon arrival but improved after taking HCTZ and losartan . CPAP therapy may contribute to improved blood pressure control. - Refilled HCTZ and losartan  prescriptions for 90-day supply - Rechecked blood pressure before leaving the clinic

## 2024-01-10 ENCOUNTER — Telehealth: Payer: Self-pay

## 2024-01-10 NOTE — Telephone Encounter (Signed)
 Incoming fax received requesting provider signature for cpap supplies for patient.   Phone number: [phone]

## 2024-01-10 NOTE — Telephone Encounter (Signed)
 Contacted [facility] to confirm order was received and if incoming fax could be discarded. Spoke with Edsel who confirms patient is scheduled to come in on the 22nd to see her at the [place] location and that it is okay to discard the incoming request as she can not see why it is needed.

## 2024-02-12 ENCOUNTER — Ambulatory Visit: Payer: Self-pay

## 2024-02-12 NOTE — Telephone Encounter (Signed)
 FYI Only or Action Required?: Action required by provider: request for appointment, clinical question for provider, and update on patient condition.  Patient was last seen in primary care on 12/24/2023 by Myrla Jon HERO, MD.  Called Nurse Triage reporting Abdominal Pain.  Symptoms began several days ago.  Interventions attempted: Ice/heat application.  Symptoms are: gradually worsening.  Triage Disposition: See Physician Within 24 Hours  Patient/caregiver understands and will follow disposition?: Yes     Message from Chasity T sent at 02/12/2024 10:17 AM EST  Reason for Triage: patient sharp pain in left side, not severe but concerning.   Reason for Disposition  [1] MILD pain (e.g., does not interfere with normal activities) AND [2] comes and goes (cramps) AND [3] present > 72 hours  (Exception: This same abdominal pain is a chronic symptom recurrent or ongoing AND present > 4 weeks.)  Answer Assessment - Initial Assessment Questions Pt called to report worsening L side pain from LUQ at bottom of ribcage to hip bone. Pt reports sensitivity to onions and possible exposure but pain has continued while GI upset has resolved. Pt states she tried heat last night with no relief; no oral medications at this time. Pt would doesn't want to take oral medications for pain relief, wants to know what is causing pain. Pt reports hx of reflux but no symptoms at this time. Pt denies any changes with bowels or urination. No radiation of pain, no n/v/d. Appt scheduled for 01/22 as soonest available but pt requested appt today or tomorrow if cancellation; placed on wait list. Pt can be reached via phone. 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     1. LOCATION: Where does it hurt?      L side; from bottom of ribcage to hip bone   2. RADIATION: Does the pain shoot anywhere else? (e.g., chest, back)     No   3. ONSET: When did the pain  begin? (e.g., minutes, hours or days ago)      Started Friday, 01/16. Pt states she is allergic to onions and may have consumed some over the weekend, she did have GI upset but that has passed but pain has persisted. Denies any continued constipation or diarrhea at this time   4. SUDDEN: Gradual or sudden onset?     Gradual   5. PATTERN Does the pain come and go, or is it constant?     Constant; worse with touch   6. SEVERITY: How bad is the pain?  (e.g., Scale 1-10; mild, moderate, or severe)     6/10  7. RECURRENT SYMPTOM: Have you ever had this type of stomach pain before? If Yes, ask: When was the last time? and What happened that time?      No   8. AGGRAVATING FACTORS: Does anything seem to cause this pain? (e.g., foods, stress, alcohol)     No   9. CARDIAC SYMPTOMS: Do you have any of the following symptoms: chest pain, difficulty breathing, sweating, nausea?     No   10. OTHER SYMPTOMS: Do you have any other symptoms? (e.g., back pain, diarrhea, fever, urination pain, vomiting)       None  Protocols used: Abdominal Pain - Upper-A-AH

## 2024-02-14 ENCOUNTER — Ambulatory Visit: Admitting: Physician Assistant

## 2024-02-14 ENCOUNTER — Encounter: Payer: Self-pay | Admitting: Physician Assistant

## 2024-02-14 VITALS — BP 129/88 | HR 84 | Ht 67.0 in | Wt 169.3 lb

## 2024-02-14 DIAGNOSIS — N200 Calculus of kidney: Secondary | ICD-10-CM | POA: Diagnosis not present

## 2024-02-14 DIAGNOSIS — R300 Dysuria: Secondary | ICD-10-CM | POA: Diagnosis not present

## 2024-02-14 DIAGNOSIS — R109 Unspecified abdominal pain: Secondary | ICD-10-CM | POA: Diagnosis not present

## 2024-02-14 DIAGNOSIS — R1012 Left upper quadrant pain: Secondary | ICD-10-CM

## 2024-02-14 LAB — POCT URINALYSIS DIPSTICK
Bilirubin, UA: NEGATIVE
Glucose, UA: NEGATIVE
Ketones, UA: NEGATIVE
Leukocytes, UA: NEGATIVE
Nitrite, UA: NEGATIVE
Odor: NEGATIVE
Protein, UA: NEGATIVE
Spec Grav, UA: 1.015
Urobilinogen, UA: 0.2 U/dL
pH, UA: 6

## 2024-02-14 NOTE — Progress Notes (Unsigned)
 " Established patient visit  Patient: Anita Cowan   DOB: Sep 13, 1970   54 y.o. Female  MRN: [id number] Visit Date: 02/14/2024  Today's healthcare provider: Jolynn Spencer, PA-C   Chief Complaint  Patient presents with   Abdominal Pain     L side pain from LUQ at bottom of ribcage to hip bone. Pt reports sensitivity to onions and possible exposure but pain has continued while GI upset has resolved. Pt states she tried heat last night with no relief; no oral medications at this time   Subjective     HPI     Abdominal Pain    Additional comments:  L side pain from LUQ at bottom of ribcage to hip bone. Pt reports sensitivity to onions and possible exposure but pain has continued while GI upset has resolved. Pt states she tried heat last night with no relief; no oral medications at this time      Last edited by Rosas, Joseline E, CMA on 02/14/2024  4:02 PM.       Discussed the use of AI scribe software for clinical note transcription with the patient, who gave verbal consent to proceed.  History of Present Illness Anita Cowan is a 54 year old female who presents with left-sided abdominal pain.  She has had persistent left-sided abdominal pain since the weekend, more noticeable since Monday. Pain is between the left upper quadrant and left hip, was initially sharp and now dull unless pressed, rated 2 to 3 out of 10. She has not taken medication because it is tolerable.  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 Over the weekend she had upset stomach after gluten cross-contamination, but bowel movements are now back to her normal pattern. She has not had a bowel movement today, which is typical for her after an upset stomach.  She denies nausea, vomiting, diarrhea, constipation, changes in stool color, or gynecologic symptoms. She notes increased urination frequency that she attributes to higher water  intake.  She has kidney stones with prior imaging showing  multiple bilateral stones and prior flank pain, but states this pain feels different, is higher, and is not associated with warmth or fever.  She has had an ablation and tubal ligation and denies gynecologic bleeding. She follows a light diet, drinks about three-fourths of a gallon of water  daily, and has minimal alcohol use, with one glass of wine over the weekend.       12/24/2023    4:10 PM 12/07/2022    8:53 AM 11/14/2021    8:54 AM  Depression screen PHQ 2/9  Decreased Interest 0 0 0  Down, Depressed, Hopeless 0 0 0  PHQ - 2 Score 0 0 0  Altered sleeping   0  Tired, decreased energy   0  Change in appetite   0  Feeling bad or failure about yourself    0  Trouble concentrating   0  Moving slowly or fidgety/restless   0  Suicidal thoughts   0  PHQ-9 Score   0   Difficult doing work/chores   Not difficult at all     Data saved with a previous flowsheet row definition      12/24/2023    4:10 PM 12/07/2022    8:53 AM  GAD 7 : Generalized Anxiety Score  Nervous, Anxious, on Edge 0  0   Control/stop worrying 0  0   Worry too much - different things 0  0   Trouble relaxing  0  Restless  0   Easily annoyed or irritable  0   Afraid - awful might happen  0   Total GAD 7 Score  0  Anxiety Difficulty  Not difficult at all     Data saved with a previous flowsheet row definition    Medications: Show/hide medication list[1]  Review of Systems All negative Except see HPI   {Insert previous labs (optional):23779} {See past labs  Heme  Chem  Endocrine  Serology  Results Review (optional):1}   Objective    BP 129/88 (BP Location: Left Arm, Patient Position: Sitting, Cuff Size: Normal)   Pulse 84   Ht 5' 7 (1.702 m)   Wt 169 lb 4.8 oz (76.8 kg)   SpO2 99%   BMI 26.52 kg/m  {Insert last BP/Wt (optional):23777}{See vitals history (optional):1}   Physical Exam Vitals reviewed.  Constitutional:      General: She is not in acute distress.    Appearance: Normal  appearance. She is well-developed. She is not diaphoretic.  HENT:     Head: Normocephalic and atraumatic.  Eyes:     General: No scleral icterus.    Conjunctiva/sclera: Conjunctivae normal.  Neck:     Thyroid: No thyromegaly.  Cardiovascular:     Rate and Rhythm: Normal rate and regular rhythm.     Pulses: Normal pulses.     Heart sounds: Normal heart sounds. No murmur heard. Pulmonary:     Effort: Pulmonary effort is normal. No respiratory distress.     Breath sounds: Normal breath sounds. No wheezing, rhonchi or rales.  Musculoskeletal:     Cervical back: Neck supple.     Right lower leg: No edema.     Left lower leg: No edema.  Lymphadenopathy:     Cervical: No cervical adenopathy.  Skin:    General: Skin is warm and dry.     Findings: No rash.  Neurological:     Mental Status: She is alert and oriented to person, place, and time. Mental status is at baseline.  Psychiatric:        Mood and Affect: Mood normal.        Behavior: Behavior normal.      No results found for any visits on 02/14/24.      Assessment and Plan Assessment & Plan Left upper quadrant abdominal pain Intermittent pain with differential including gastrointestinal and renal causes. Mild tenderness noted on exam. - Ordered urinalysis and urine culture. - Ordered lipase. - Ordered H. pylori breath test. - Ordered electrolytes. - Advised to monitor symptoms and report if pain worsens or persists. - Consider imaging if symptoms worsen or do not resolve.  Nephrolithiasis Multiple kidney stones with atypical presentation for current pain. No urinary tract infection or significant flank pain. - Encouraged increased water  intake. - Advised to monitor for changes in urinary symptoms or pain.  1. Dysuria (Primary) *** - POCT urinalysis dipstick - Urine Culture - Lipase - H. pylori breath test - CBC w/Diff/Platelet - Urine Microscopic  2. LUQ abdominal pain *** - Lipase - H. pylori breath  test - CBC w/Diff/Platelet - Comprehensive Metabolic Panel (CMET) - Urine Microscopic  3. Nephrolithiasis *** - POCT urinalysis dipstick - Urine Culture - CBC w/Diff/Platelet - Comprehensive Metabolic Panel (CMET) - Urine Microscopic    No orders of the defined types were placed in this encounter.   No follow-ups on file.   The patient was advised to call back or seek an in-person evaluation if the symptoms worsen  or if the condition fails to improve as anticipated.  I discussed the assessment and treatment plan with the patient. The patient was provided an opportunity to ask questions and all were answered. The patient agreed with the plan and demonstrated an understanding of the instructions.  I, Mykal Kirchman, PA-C have reviewed all documentation for this visit. The documentation on 02/14/2024  for the exam, diagnosis, procedures, and orders are all accurate and complete.  Jolynn Spencer, [facility], MMS [facility] [phone] (phone) [phone] (fax)  [facility]     [1]  Outpatient Medications Prior to Visit  Medication Sig   hydrochlorothiazide  (HYDRODIURIL ) 25 MG tablet Take 1 tablet (25 mg total) by mouth daily as needed.   losartan  (COZAAR ) 50 MG tablet Take 1 tablet (50 mg total) by mouth daily.   celecoxib  (CELEBREX ) 100 MG capsule Take 1 capsule (100 mg total) by mouth 2 (two) times daily. (Patient not taking: Reported on 02/14/2024)   No facility-administered medications prior to visit.   "

## 2024-02-15 ENCOUNTER — Other Ambulatory Visit: Payer: Self-pay | Admitting: Physician Assistant

## 2024-02-16 LAB — COMPREHENSIVE METABOLIC PANEL WITH GFR
ALT: 27 [IU]/L (ref 0–32)
AST: 20 [IU]/L (ref 0–40)
Albumin: 4.6 g/dL (ref 3.8–4.9)
Alkaline Phosphatase: 59 [IU]/L (ref 49–135)
BUN/Creatinine Ratio: 20 (ref 9–23)
BUN: 16 mg/dL (ref 6–24)
Bilirubin Total: 0.3 mg/dL (ref 0.0–1.2)
CO2: 22 mmol/L (ref 20–29)
Calcium: 9.7 mg/dL (ref 8.7–10.2)
Chloride: 100 mmol/L (ref 96–106)
Creatinine, Ser: 0.8 mg/dL (ref 0.57–1.00)
Globulin, Total: 2.2 g/dL (ref 1.5–4.5)
Glucose: 98 mg/dL (ref 70–99)
Potassium: 4.5 mmol/L (ref 3.5–5.2)
Sodium: 139 mmol/L (ref 134–144)
Total Protein: 6.8 g/dL (ref 6.0–8.5)
eGFR: 89 mL/min/{1.73_m2}

## 2024-02-16 LAB — CBC WITH DIFFERENTIAL/PLATELET
Basophils Absolute: 0 10*3/uL (ref 0.0–0.2)
Basos: 1 %
EOS (ABSOLUTE): 0.1 10*3/uL (ref 0.0–0.4)
Eos: 1 %
Hematocrit: 42.7 % (ref 34.0–46.6)
Hemoglobin: 14.4 g/dL (ref 11.1–15.9)
Immature Grans (Abs): 0 10*3/uL (ref 0.0–0.1)
Immature Granulocytes: 0 %
Lymphocytes Absolute: 1.3 10*3/uL (ref 0.7–3.1)
Lymphs: 19 %
MCH: 30.8 pg (ref 26.6–33.0)
MCHC: 33.7 g/dL (ref 31.5–35.7)
MCV: 91 fL (ref 79–97)
Monocytes Absolute: 0.2 10*3/uL (ref 0.1–0.9)
Monocytes: 3 %
Neutrophils Absolute: 5 10*3/uL (ref 1.4–7.0)
Neutrophils: 75 %
Platelets: 328 10*3/uL (ref 150–450)
RBC: 4.67 x10E6/uL (ref 3.77–5.28)
RDW: 12.8 % (ref 11.7–15.4)
WBC: 6.6 10*3/uL (ref 3.4–10.8)

## 2024-02-16 LAB — URINALYSIS, MICROSCOPIC ONLY
Bacteria, UA: NONE SEEN
Casts: NONE SEEN /LPF
Epithelial Cells (non renal): NONE SEEN /HPF (ref 0–10)

## 2024-02-16 LAB — LIPASE: Lipase: 90 U/L — ABNORMAL HIGH (ref 14–72)

## 2024-02-18 ENCOUNTER — Ambulatory Visit: Payer: Self-pay | Admitting: Physician Assistant

## 2024-02-18 LAB — URINE CULTURE: Organism ID, Bacteria: NO GROWTH

## 2024-02-19 ENCOUNTER — Ambulatory Visit: Payer: Self-pay | Admitting: Physician Assistant

## 2024-02-20 LAB — CBC WITH DIFFERENTIAL/PLATELET

## 2024-02-20 LAB — COMPREHENSIVE METABOLIC PANEL WITH GFR

## 2024-02-22 LAB — CBC WITH DIFFERENTIAL/PLATELET

## 2024-02-22 LAB — COMPREHENSIVE METABOLIC PANEL WITH GFR

## 2024-02-22 LAB — LIPASE

## 2024-02-22 LAB — H. PYLORI BREATH TEST

## 2024-02-28 ENCOUNTER — Ambulatory Visit: Payer: Self-pay | Admitting: Family Medicine

## 2024-02-28 LAB — COMPREHENSIVE METABOLIC PANEL WITH GFR
ALT: 29 [IU]/L (ref 0–32)
AST: 19 [IU]/L (ref 0–40)
Albumin: 4.7 g/dL (ref 3.8–4.9)
Alkaline Phosphatase: 52 [IU]/L (ref 49–135)
BUN/Creatinine Ratio: 21 (ref 9–23)
BUN: 18 mg/dL (ref 6–24)
Bilirubin Total: 0.4 mg/dL (ref 0.0–1.2)
CO2: 20 mmol/L (ref 20–29)
Calcium: 9.8 mg/dL (ref 8.7–10.2)
Chloride: 102 mmol/L (ref 96–106)
Creatinine, Ser: 0.84 mg/dL (ref 0.57–1.00)
Globulin, Total: 2.6 g/dL (ref 1.5–4.5)
Glucose: 103 mg/dL — ABNORMAL HIGH (ref 70–99)
Potassium: 4.8 mmol/L (ref 3.5–5.2)
Sodium: 139 mmol/L (ref 134–144)
Total Protein: 7.3 g/dL (ref 6.0–8.5)
eGFR: 84 mL/min/{1.73_m2}

## 2024-02-28 LAB — LIPID PANEL
Chol/HDL Ratio: 4.6 ratio — ABNORMAL HIGH (ref 0.0–4.4)
Cholesterol, Total: 262 mg/dL — ABNORMAL HIGH (ref 100–199)
HDL: 57 mg/dL
LDL Chol Calc (NIH): 185 mg/dL — ABNORMAL HIGH (ref 0–99)
Triglycerides: 112 mg/dL (ref 0–149)
VLDL Cholesterol Cal: 20 mg/dL (ref 5–40)

## 2024-02-28 LAB — HEMOGLOBIN A1C
Est. average glucose Bld gHb Est-mCnc: 105 mg/dL
Hgb A1c MFr Bld: 5.3 % (ref 4.8–5.6)

## 2024-03-04 ENCOUNTER — Ambulatory Visit: Admitting: Family Medicine
